# Patient Record
Sex: Male | Born: 1980 | Race: Black or African American | Hispanic: No | Smoking: Current every day smoker
Health system: Southern US, Community
[De-identification: ages and names within clinical notes are randomized; demographics above are authoritative.]

## PROBLEM LIST (undated history)

## (undated) HISTORY — PX: WISDOM TOOTH EXTRACTION: SHX21

---

## 2012-07-01 ENCOUNTER — Encounter (HOSPITAL_BASED_OUTPATIENT_CLINIC_OR_DEPARTMENT_OTHER): Payer: Self-pay | Admitting: Emergency Medicine

## 2012-07-01 ENCOUNTER — Emergency Department (HOSPITAL_BASED_OUTPATIENT_CLINIC_OR_DEPARTMENT_OTHER)
Admission: EM | Admit: 2012-07-01 | Discharge: 2012-07-01 | Disposition: A | Discharge status: Home / Self Care | Attending: Emergency Medicine | Admitting: Emergency Medicine

## 2012-07-01 ENCOUNTER — Emergency Department (HOSPITAL_BASED_OUTPATIENT_CLINIC_OR_DEPARTMENT_OTHER)

## 2012-07-01 DIAGNOSIS — S0181XA Laceration without foreign body of other part of head, initial encounter: Secondary | ICD-10-CM

## 2012-07-01 DIAGNOSIS — Y9210 Unspecified residential institution as the place of occurrence of the external cause: Secondary | ICD-10-CM | POA: Insufficient documentation

## 2012-07-01 DIAGNOSIS — S01409A Unspecified open wound of unspecified cheek and temporomandibular area, initial encounter: Secondary | ICD-10-CM | POA: Insufficient documentation

## 2012-07-01 DIAGNOSIS — S0990XA Unspecified injury of head, initial encounter: Secondary | ICD-10-CM | POA: Insufficient documentation

## 2012-07-01 DIAGNOSIS — H1130 Conjunctival hemorrhage, unspecified eye: Secondary | ICD-10-CM

## 2012-07-01 DIAGNOSIS — T148XXA Other injury of unspecified body region, initial encounter: Secondary | ICD-10-CM | POA: Insufficient documentation

## 2012-07-01 DIAGNOSIS — H538 Other visual disturbances: Secondary | ICD-10-CM | POA: Insufficient documentation

## 2012-07-01 MED ORDER — ACETAMINOPHEN 325 MG PO TABS: 650.0000 mg | ORAL_TABLET | Freq: Four times a day (QID) | ORAL | Status: DC | PRN

## 2012-07-01 MED ORDER — LIDOCAINE HCL 2 % IJ SOLN
20.0000 mL | Freq: Once | INTRAMUSCULAR | Status: AC
  Administered 2012-07-01: 20 mg via INTRADERMAL
  Filled 2012-07-01: qty 1

## 2012-07-01 MED ORDER — LIDOCAINE-EPINEPHRINE 2 %-1:100000 IJ SOLN: 20.0000 mL | Freq: Once | INTRAMUSCULAR | Status: DC
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
## 2012-07-02 NOTE — ED Provider Notes (Signed)
Medical screening examination/treatment/procedure(s) were performed by non-physician practitioner and as supervising physician I was immediately available for consultation/collaboration.   Lindzey Zent, MD 07/02/12 0016 

## 2013-03-22 ENCOUNTER — Encounter (HOSPITAL_BASED_OUTPATIENT_CLINIC_OR_DEPARTMENT_OTHER): Payer: Self-pay | Admitting: *Deleted

## 2013-03-22 ENCOUNTER — Emergency Department (HOSPITAL_BASED_OUTPATIENT_CLINIC_OR_DEPARTMENT_OTHER)
Admission: EM | Admit: 2013-03-22 | Discharge: 2013-03-22 | Disposition: A | Discharge status: Home / Self Care | Payer: No Typology Code available for payment source | Attending: Emergency Medicine | Admitting: Emergency Medicine

## 2013-03-22 ENCOUNTER — Emergency Department (HOSPITAL_BASED_OUTPATIENT_CLINIC_OR_DEPARTMENT_OTHER): Payer: No Typology Code available for payment source

## 2013-03-22 DIAGNOSIS — S161XXA Strain of muscle, fascia and tendon at neck level, initial encounter: Secondary | ICD-10-CM

## 2013-03-22 DIAGNOSIS — Y92410 Unspecified street and highway as the place of occurrence of the external cause: Secondary | ICD-10-CM | POA: Insufficient documentation

## 2013-03-22 DIAGNOSIS — Y9389 Activity, other specified: Secondary | ICD-10-CM | POA: Insufficient documentation

## 2013-03-22 DIAGNOSIS — IMO0002 Reserved for concepts with insufficient information to code with codable children: Secondary | ICD-10-CM | POA: Insufficient documentation

## 2013-03-22 DIAGNOSIS — S139XXA Sprain of joints and ligaments of unspecified parts of neck, initial encounter: Secondary | ICD-10-CM | POA: Insufficient documentation

## 2013-03-22 DIAGNOSIS — F17200 Nicotine dependence, unspecified, uncomplicated: Secondary | ICD-10-CM | POA: Insufficient documentation

## 2013-03-22 MED ORDER — OXYCODONE-ACETAMINOPHEN 5-325 MG PO TABS: 1.0000 | ORAL_TABLET | Freq: Four times a day (QID) | ORAL | Status: DC | PRN

## 2013-03-22 MED ORDER — CYCLOBENZAPRINE HCL 10 MG PO TABS: 10.0000 mg | ORAL_TABLET | Freq: Two times a day (BID) | ORAL | Status: DC | PRN
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
## 2013-03-24 ENCOUNTER — Encounter (HOSPITAL_COMMUNITY): Payer: Self-pay | Admitting: Emergency Medicine

## 2013-03-24 ENCOUNTER — Emergency Department (HOSPITAL_COMMUNITY)
Admission: EM | Admit: 2013-03-24 | Discharge: 2013-03-24 | Discharge status: Against Medical Advice | Payer: No Typology Code available for payment source | Attending: Emergency Medicine | Admitting: Emergency Medicine

## 2013-03-24 ENCOUNTER — Encounter (HOSPITAL_BASED_OUTPATIENT_CLINIC_OR_DEPARTMENT_OTHER): Payer: Self-pay | Admitting: *Deleted

## 2013-03-24 ENCOUNTER — Emergency Department (HOSPITAL_BASED_OUTPATIENT_CLINIC_OR_DEPARTMENT_OTHER)
Admission: EM | Admit: 2013-03-24 | Discharge: 2013-03-24 | Disposition: A | Discharge status: Home / Self Care | Payer: No Typology Code available for payment source | Attending: Emergency Medicine | Admitting: Emergency Medicine

## 2013-03-24 DIAGNOSIS — S4980XA Other specified injuries of shoulder and upper arm, unspecified arm, initial encounter: Secondary | ICD-10-CM | POA: Insufficient documentation

## 2013-03-24 DIAGNOSIS — S46909A Unspecified injury of unspecified muscle, fascia and tendon at shoulder and upper arm level, unspecified arm, initial encounter: Secondary | ICD-10-CM | POA: Insufficient documentation

## 2013-03-24 DIAGNOSIS — Y92410 Unspecified street and highway as the place of occurrence of the external cause: Secondary | ICD-10-CM | POA: Insufficient documentation

## 2013-03-24 DIAGNOSIS — Y9389 Activity, other specified: Secondary | ICD-10-CM | POA: Insufficient documentation

## 2013-03-24 DIAGNOSIS — S139XXA Sprain of joints and ligaments of unspecified parts of neck, initial encounter: Secondary | ICD-10-CM | POA: Insufficient documentation

## 2013-03-24 DIAGNOSIS — F17200 Nicotine dependence, unspecified, uncomplicated: Secondary | ICD-10-CM | POA: Insufficient documentation

## 2013-03-24 DIAGNOSIS — Z5321 Procedure and treatment not carried out due to patient leaving prior to being seen by health care provider: Secondary | ICD-10-CM

## 2013-03-24 DIAGNOSIS — S161XXD Strain of muscle, fascia and tendon at neck level, subsequent encounter: Secondary | ICD-10-CM

## 2013-03-24 DIAGNOSIS — S0993XA Unspecified injury of face, initial encounter: Secondary | ICD-10-CM | POA: Insufficient documentation

## 2013-03-24 MED ORDER — CYCLOBENZAPRINE HCL 5 MG PO TABS: 5.0000 mg | ORAL_TABLET | Freq: Three times a day (TID) | ORAL | Status: DC | PRN

## 2013-03-24 MED ORDER — ETODOLAC 500 MG PO TABS: 500.0000 mg | ORAL_TABLET | Freq: Two times a day (BID) | ORAL | Status: DC

## 2013-03-24 NOTE — ED Notes (Signed)
Called to treatment room. No answer in w/a.
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
## 2013-03-24 NOTE — ED Provider Notes (Signed)
Patient left without being seen post-triage.  Jimmye Norman, NP 03/24/13 [phone]

## 2013-03-24 NOTE — ED Notes (Signed)
Pt states he refused to wait for doctor any longer, attempted to reassure pt that doctor would be in soon.  Pt refused to wait any longer, walked out of ER.
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
## 2013-03-29 ENCOUNTER — Ambulatory Visit: Payer: Self-pay | Admitting: Family Medicine

## 2013-03-29 DIAGNOSIS — Z0289 Encounter for other administrative examinations: Secondary | ICD-10-CM

## 2013-07-02 ENCOUNTER — Encounter (HOSPITAL_COMMUNITY): Payer: Self-pay | Admitting: *Deleted

## 2013-07-02 DIAGNOSIS — L255 Unspecified contact dermatitis due to plants, except food: Secondary | ICD-10-CM | POA: Insufficient documentation

## 2013-07-02 DIAGNOSIS — F17200 Nicotine dependence, unspecified, uncomplicated: Secondary | ICD-10-CM | POA: Insufficient documentation

## 2013-07-02 MED ORDER — DIPHENHYDRAMINE HCL 25 MG PO CAPS
25.0000 mg | ORAL_CAPSULE | Freq: Once | ORAL | Status: AC
  Administered 2013-07-02: 25 mg via ORAL
  Filled 2013-07-02: qty 1

## 2013-07-02 NOTE — ED Notes (Signed)
The pt  Has had a generalized rash since last pm and the rash is getting worse.  No diff breathing.

## 2013-07-03 ENCOUNTER — Emergency Department (HOSPITAL_COMMUNITY)
Admission: EM | Admit: 2013-07-03 | Discharge: 2013-07-03 | Disposition: A | Discharge status: Home / Self Care | Payer: Self-pay | Attending: Emergency Medicine | Admitting: Emergency Medicine

## 2013-07-03 DIAGNOSIS — L237 Allergic contact dermatitis due to plants, except food: Secondary | ICD-10-CM

## 2013-07-03 MED ORDER — HYDROCORTISONE 1 % EX CREA: TOPICAL_CREAM | Freq: Two times a day (BID) | CUTANEOUS | Status: DC

## 2013-07-03 MED ORDER — DIPHENHYDRAMINE HCL 25 MG PO TABS: 25.0000 mg | ORAL_TABLET | Freq: Four times a day (QID) | ORAL | Status: DC | PRN

## 2013-07-03 NOTE — ED Notes (Signed)
Pt states he was working the yard with family member yesterday and the itchy, generalized rash developed after that.

## 2013-07-03 NOTE — ED Provider Notes (Addendum)
History    CSN: [id number] Arrival date & time 07/02/13  2218  First MD Initiated Contact with Patient 07/03/13 0144     Chief Complaint  Patient presents with  . Rash   (Consider location/radiation/quality/duration/timing/severity/associated sxs/prior Treatment) HPI Comments: 32 year old male with no significant past medical history who presents with a complaint of a rash. The rash started last night, has been persistent, it is itchy, it has not spread onto his trunk, chest abdomen or back. He has no rash in his genital area, no rash above the elbows, no rash above the knees. He denies any bites, denies any topical exposures until he was asked about poison ivy, he was doing yard work yesterday trimming the hedges.  Patient is a 32 y.o. male presenting with rash. The history is provided by the patient.  Rash Associated symptoms: no fever    History reviewed. No pertinent past medical history. Past Surgical History  Procedure Laterality Date  . Wisdom tooth extraction     No family history on file. History  Substance Use Topics  . Smoking status: Current Every Day Smoker -- 0.50 packs/day    Types: Cigarettes  . Smokeless tobacco: Never Used  . Alcohol Use: Yes     Comment: once a month    Review of Systems  Constitutional: Negative for fever.  Skin: Positive for rash.    Allergies  Review of patient's allergies indicates no known allergies.  Home Medications   Current Outpatient Rx  Name  Route  Sig  Dispense  Refill  . diphenhydrAMINE (BENADRYL) 25 MG tablet   Oral   Take 1 tablet (25 mg total) by mouth every 6 (six) hours as needed for itching (Rash).   30 tablet   0   . hydrocortisone cream 1 %   Topical   Apply topically 2 (two) times daily.   30 g   2    BP 129/72  Pulse 73  Temp(Src) 98.1 F (36.7 C) (Oral)  Resp 18  SpO2 98% Physical Exam  Nursing note and vitals reviewed. Constitutional: He appears well-developed and well-nourished. No  distress.  HENT:  Head: Normocephalic and atraumatic.  Mouth/Throat: Oropharynx is clear and moist. No oropharyngeal exudate.  Eyes: Conjunctivae and EOM are normal. Pupils are equal, round, and reactive to light. Right eye exhibits no discharge. Left eye exhibits no discharge. No scleral icterus.  Neck: Normal range of motion. Neck supple. No JVD present. No thyromegaly present.  Cardiovascular: Normal rate, regular rhythm, normal heart sounds and intact distal pulses.  Exam reveals no gallop and no friction rub.   No murmur heard. Pulmonary/Chest: Effort normal and breath sounds normal. No respiratory distress. He has no wheezes. He has no rales.  Abdominal: Soft. Bowel sounds are normal. He exhibits no distension and no mass. There is no tenderness.  Musculoskeletal: Normal range of motion. He exhibits no edema and no tenderness.  Lymphadenopathy:    He has no cervical adenopathy.  Neurological: He is alert. Coordination normal.  Skin: Skin is warm and dry. Rash noted. No erythema.  Erythematous papular and vesicular rash to the bilateral arms and legs below the elbows and knees, linear patterns consistent with a poison ivy pattern  Psychiatric: He has a normal mood and affect. His behavior is normal.    ED Course  Procedures (including critical care time) Labs Reviewed - No data to display No results found. 1. Contact dermatitis due to poison ivy     MDM  This  is a contact dermatitis from poison ivy exposure, he appears hemodynamically stable, he will be given topical and benadryl   Meds given in ED:  Medications  diphenhydrAMINE (BENADRYL) capsule 25 mg (25 mg Oral Given 07/02/13 2231)    New Prescriptions   DIPHENHYDRAMINE (BENADRYL) 25 MG TABLET    Take 1 tablet (25 mg total) by mouth every 6 (six) hours as needed for itching (Rash).   HYDROCORTISONE CREAM 1 %    Apply topically 2 (two) times daily.      Vida Roller, MD 07/03/13 4540  Vida Roller, MD 07/03/13  [phone]

## 2016-11-01 ENCOUNTER — Encounter (HOSPITAL_COMMUNITY): Payer: Self-pay | Admitting: *Deleted

## 2016-11-01 ENCOUNTER — Emergency Department (HOSPITAL_COMMUNITY): Payer: Self-pay

## 2016-11-01 ENCOUNTER — Emergency Department (HOSPITAL_COMMUNITY)
Admission: EM | Admit: 2016-11-01 | Discharge: 2016-11-01 | Disposition: A | Discharge status: Home / Self Care | Payer: Self-pay | Attending: Emergency Medicine | Admitting: Emergency Medicine

## 2016-11-01 DIAGNOSIS — F17210 Nicotine dependence, cigarettes, uncomplicated: Secondary | ICD-10-CM | POA: Insufficient documentation

## 2016-11-01 DIAGNOSIS — M25561 Pain in right knee: Secondary | ICD-10-CM | POA: Insufficient documentation

## 2016-11-01 DIAGNOSIS — M25511 Pain in right shoulder: Secondary | ICD-10-CM | POA: Insufficient documentation

## 2016-11-01 DIAGNOSIS — Y999 Unspecified external cause status: Secondary | ICD-10-CM | POA: Insufficient documentation

## 2016-11-01 DIAGNOSIS — Y92410 Unspecified street and highway as the place of occurrence of the external cause: Secondary | ICD-10-CM | POA: Insufficient documentation

## 2016-11-01 DIAGNOSIS — Y939 Activity, unspecified: Secondary | ICD-10-CM | POA: Insufficient documentation

## 2016-11-01 MED ORDER — ACETAMINOPHEN 325 MG PO TABS
650.0000 mg | ORAL_TABLET | Freq: Once | ORAL | Status: AC
  Administered 2016-11-01: 650 mg via ORAL
  Filled 2016-11-01: qty 2

## 2016-11-01 MED ORDER — NAPROXEN 375 MG PO TABS: 375.0000 mg | ORAL_TABLET | Freq: Two times a day (BID) | ORAL | 0 refills | Status: DC

## 2016-11-01 NOTE — ED Notes (Signed)
Bed: WTR8 Expected date:  Expected time:  Means of arrival:  Comments: 

## 2016-11-01 NOTE — Discharge Instructions (Signed)
All of your x-rays of the negative today. This likely normal musculoskeletal pain. I have given you naproxen to take twice a day. May also take Tylenol as well for the pain. Standing under a hot shower will help with the muscle pain. If her symptoms not resolve in the next 5-7 days follow-up with a primary care doctor. Return to the ED if her symptoms worsen.
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
## 2021-08-16 ENCOUNTER — Emergency Department (HOSPITAL_COMMUNITY)
Admission: EM | Admit: 2021-08-16 | Discharge: 2021-08-16 | Disposition: A | Discharge status: Home / Self Care | Payer: Self-pay | Attending: Emergency Medicine | Admitting: Emergency Medicine

## 2021-08-16 ENCOUNTER — Other Ambulatory Visit: Payer: Self-pay

## 2021-08-16 ENCOUNTER — Encounter (HOSPITAL_COMMUNITY): Payer: Self-pay | Admitting: Emergency Medicine

## 2021-08-16 DIAGNOSIS — K122 Cellulitis and abscess of mouth: Secondary | ICD-10-CM | POA: Insufficient documentation

## 2021-08-16 DIAGNOSIS — F17210 Nicotine dependence, cigarettes, uncomplicated: Secondary | ICD-10-CM | POA: Insufficient documentation

## 2021-08-16 DIAGNOSIS — L0291 Cutaneous abscess, unspecified: Secondary | ICD-10-CM

## 2021-08-16 MED ORDER — LIDOCAINE-EPINEPHRINE (PF) 2 %-1:200000 IJ SOLN
10.0000 mL | Freq: Once | INTRAMUSCULAR | Status: AC
  Administered 2021-08-16: 10 mL
  Filled 2021-08-16: qty 20

## 2021-08-16 MED ORDER — CEPHALEXIN 250 MG PO CAPS
500.0000 mg | ORAL_CAPSULE | Freq: Once | ORAL | Status: AC
  Administered 2021-08-16: 500 mg via ORAL
  Filled 2021-08-16: qty 2

## 2021-08-16 MED ORDER — CEPHALEXIN 500 MG PO CAPS: 500.0000 mg | ORAL_CAPSULE | Freq: Four times a day (QID) | ORAL | 0 refills | Status: DC

## 2021-08-16 NOTE — ED Provider Notes (Signed)
[facility] EMERGENCY DEPARTMENT Provider Note   CSN: [id number] Arrival date & time: 08/16/21  2028     History Chief Complaint  Patient presents with   Abscess    Jeremy Chambers is a 40 y.o. male.  HPI  Patient presenting with abscess to right jaw x1 week.  Started as a small bump, has been significantly increasing in size.  Patient has tried to pop it himself and is also tried applying mupirocin cream and using peroxide.  States that the wound has opened up and has been draining, he denies any fevers or chills at the home.  Denies any aggravating factors.  Patient is not diabetic.   History reviewed. No pertinent past medical history.  There are no problems to display for this patient.   Past Surgical History:  Procedure Laterality Date   WISDOM TOOTH EXTRACTION         No family history on file.  Social History   Tobacco Use   Smoking status: Every Day    Packs/day: 0.50    Types: Cigarettes   Smokeless tobacco: Never  Substance Use Topics   Alcohol use: Yes    Comment: once a month   Drug use: No    Home Medications Prior to Admission medications   Medication Sig Start Date End Date Taking? Authorizing Provider  diphenhydrAMINE (BENADRYL) 25 MG tablet Take 1 tablet (25 mg total) by mouth every 6 (six) hours as needed for itching (Rash). 07/03/13   Eber Hong, MD  hydrocortisone cream 1 % Apply topically 2 (two) times daily. 07/03/13   Eber Hong, MD  naproxen (NAPROSYN) 375 MG tablet Take 1 tablet (375 mg total) by mouth 2 (two) times daily. 11/01/16   Rise Mu, PA-C    Allergies    Patient has no known allergies.  Review of Systems   Review of Systems  Constitutional:  Negative for fever.  HENT:  Negative for drooling, facial swelling and trouble swallowing.   Gastrointestinal:  Negative for nausea.  Skin:  Positive for color change and wound.   Physical Exam Updated Vital Signs BP 127/74 (BP Location: Right Arm)    Pulse 89   Temp 98.8 F (37.1 C) (Oral)   Resp 16   Ht 6\' 3"  (1.905 m)   Wt 132 kg   SpO2 98%   BMI 36.37 kg/m   Physical Exam Vitals and nursing note reviewed. Exam conducted with a chaperone present.  Constitutional:      General: He is not in acute distress.    Appearance: Normal appearance.  HENT:     Head: Normocephalic and atraumatic.  Eyes:     General: No scleral icterus.    Extraocular Movements: Extraocular movements intact.     Pupils: Pupils are equal, round, and reactive to light.  Skin:    Coloration: Skin is not jaundiced.     Comments: See photos. No surrounding erythema consistent with cellulitis.  There is an open abscess with purulent material. Not actively draining.  Neurological:     Mental Status: He is alert. Mental status is at baseline.     Coordination: Coordination normal.     ED Results / Procedures / Treatments   Labs (all labs ordered are listed, but only abnormal results are displayed) Labs Reviewed - No data to display  EKG None  Radiology No results found.  Procedures . Incision and Drainage  Date/Time: 08/16/2021 9:57 PM Performed by: 08/18/2021, PA-C Authorized by: Theron Arista, PA-C  Consent:    Consent obtained:  Verbal   Consent given by:  Patient   Risks, benefits, and alternatives were discussed: yes     Risks discussed:  Incomplete drainage, bleeding, pain and infection   Alternatives discussed:  No treatment, delayed treatment and alternative treatment Location:    Type:  Abscess   Location:  Head   Head location:  Face Pre-procedure details:    Skin preparation:  Chlorhexidine with alcohol Sedation:    Sedation type:  None Anesthesia:    Anesthesia method:  Local infiltration   Local anesthetic:  Lidocaine 1% WITH epi Procedure type:    Complexity:  Simple Procedure details:    Incision types:  Stab incision   Incision depth:  Dermal   Wound management:  Probed and deloculated   Drainage:  Purulent    Drainage amount:  Scant   Wound treatment:  Wound left open   Packing materials:  None Post-procedure details:    Procedure completion:  Tolerated well, no immediate complications   Medications Ordered in ED Medications - No data to display  ED Course  I have reviewed the triage vital signs and the nursing notes.  Pertinent labs & imaging results that were available during my care of the patient were reviewed by me and considered in my medical decision making (see chart for details).    MDM Rules/Calculators/A&P                           Patient with abscess to right jaw.  No underlying cellulitis, location is not consistent with a suppurative parotitis.  He is afebrile with stable vital signs, will I&D.  The wound is already open, will  deloculated.  Patient handled the procedure well, no immediate complications.  There was scant purulent drainage expressed, wound left open.  We will put him on antibiotic and have him follow-up with the wound check in 4 days if there is no improvement.  Return precautions were discussed.  Patient is appropriate for discharge at this time.  Final Clinical Impression(s) / ED Diagnoses Final diagnoses:  None    Rx / DC Orders ED Discharge Orders     None        Theron Arista, Cordelia Poche 08/16/21 2159    Cathren Laine, MD 08/20/21 [phone]

## 2021-08-16 NOTE — ED Triage Notes (Signed)
Patient reports worsening skin abscess at right jaw with swelling and purulent drainage onset this week , no fever or chills .

## 2021-08-16 NOTE — Discharge Instructions (Addendum)
Take keflex 4x daily for 5 days.  We are giving you the first dose here in the ED, we can start once you pick it up in the morning tomorrow. Wash with soap and water, do not use any peroxide or alcohol. I recommend using heat compresses and hot water when possible as this can help express some of the pus. In 4 days if you do not notice significant improvement please go to an urgent care or ER for wound recheck. If you develop fevers, chills, spreading redness please return back to the ED for further evaluation.

## 2021-08-16 NOTE — ED Notes (Signed)
abscess to rt side of cheek. Per pt there for a week. Has became larger and worse in pain

## 2022-07-25 ENCOUNTER — Emergency Department (HOSPITAL_COMMUNITY)
Admission: EM | Admit: 2022-07-25 | Discharge: 2022-07-25 | Disposition: A | Discharge status: Home / Self Care | Payer: Self-pay | Attending: Emergency Medicine | Admitting: Emergency Medicine

## 2022-07-25 ENCOUNTER — Other Ambulatory Visit: Payer: Self-pay

## 2022-07-25 DIAGNOSIS — L0291 Cutaneous abscess, unspecified: Secondary | ICD-10-CM

## 2022-07-25 DIAGNOSIS — L02413 Cutaneous abscess of right upper limb: Secondary | ICD-10-CM | POA: Insufficient documentation

## 2022-07-25 DIAGNOSIS — Z23 Encounter for immunization: Secondary | ICD-10-CM | POA: Insufficient documentation

## 2022-07-25 MED ORDER — LIDOCAINE-EPINEPHRINE (PF) 2 %-1:200000 IJ SOLN
10.0000 mL | Freq: Once | INTRAMUSCULAR | Status: AC
  Administered 2022-07-25: 10 mL
  Filled 2022-07-25: qty 20

## 2022-07-25 MED ORDER — TETANUS-DIPHTH-ACELL PERTUSSIS 5-2.5-18.5 LF-MCG/0.5 IM SUSY
0.5000 mL | PREFILLED_SYRINGE | Freq: Once | INTRAMUSCULAR | Status: AC
  Administered 2022-07-25: 0.5 mL via INTRAMUSCULAR
  Filled 2022-07-25: qty 0.5

## 2022-07-25 MED ORDER — DOXYCYCLINE HYCLATE 100 MG PO CAPS: 100.0000 mg | ORAL_CAPSULE | Freq: Two times a day (BID) | ORAL | 0 refills | Status: DC

## 2022-07-25 NOTE — ED Triage Notes (Signed)
Pt with raised, swollen area to R forearm x 2 days. Denies injury or known inset bite. Reports puss and blood drained from it yesterday.

## 2022-07-25 NOTE — ED Notes (Signed)
Provider at bedside to I&D area to RFA

## 2022-07-25 NOTE — Discharge Instructions (Addendum)
You are seen today with abscess in the skin.  This was drained, wash with soap and water.  Can apply topical antibiotic as indicated.  Take the doxycycline twice daily for 10 days, this will treat the superficial infection.  Follow-up with your primary care doctor, information above.  Return to the ED if you have fever, redness spreading up your arm, new or concerning symptoms.  Your tetanus was updated today, good for 10 years.

## 2022-07-25 NOTE — ED Provider Notes (Signed)
[facility] EMERGENCY DEPARTMENT Provider Note   CSN: [id number] Arrival date & time: 07/25/22  0815     History  No chief complaint on file.   Jeremy Chambers is a 41 y.o. male.  HPI  Patient presents with abscess to extensor right forearm.  Started 2 days ago as a small pimple, he noticed it while at work.  He works at [employer], he squeezed it and pus and blood came out yesterday.  It is painful, there is some surrounding redness.  Denies any known bug bites, he has not been outside recently.  Unsure about his last tetanus, denies any history of diabetes and MRSA colonization.  Home Medications Prior to Admission medications   Medication Sig Start Date End Date Taking? Authorizing Provider  cephALEXin (KEFLEX) 500 MG capsule Take 1 capsule (500 mg total) by mouth 4 (four) times daily. 08/16/21   Theron Arista, PA-C  diphenhydrAMINE (BENADRYL) 25 MG tablet Take 1 tablet (25 mg total) by mouth every 6 (six) hours as needed for itching (Rash). 07/03/13   Eber Hong, MD  hydrocortisone cream 1 % Apply topically 2 (two) times daily. 07/03/13   Eber Hong, MD  naproxen (NAPROSYN) 375 MG tablet Take 1 tablet (375 mg total) by mouth 2 (two) times daily. 11/01/16   Rise Mu, PA-C      Allergies    Patient has no known allergies.    Review of Systems   Review of Systems  Physical Exam Updated Vital Signs BP 135/87 (BP Location: Left Arm)   Pulse 68   Temp 98.6 F (37 C) (Oral)   Resp 18   SpO2 99%  Physical Exam Vitals and nursing note reviewed. Exam conducted with a chaperone present.  Constitutional:      General: He is not in acute distress.    Appearance: Normal appearance.  HENT:     Head: Normocephalic and atraumatic.  Eyes:     General: No scleral icterus.    Extraocular Movements: Extraocular movements intact.     Pupils: Pupils are equal, round, and reactive to light.  Cardiovascular:     Pulses: Normal pulses.  Skin:    Capillary  Refill: Capillary refill takes less than 2 seconds.     Coloration: Skin is not jaundiced.     Findings: Erythema present.     Comments: 1.5 cm raised pustule with surrounding induration.  There is overlying erythema roughly 3 cm to the extensor forearm.  Neurological:     Mental Status: He is alert. Mental status is at baseline.     Coordination: Coordination normal.     ED Results / Procedures / Treatments   Labs (all labs ordered are listed, but only abnormal results are displayed) Labs Reviewed - No data to display  EKG None  Radiology No results found.  Procedures .Marland KitchenIncision and Drainage  Date/Time: 07/25/2022 9:24 AM  Performed by: Theron Arista, PA-C Authorized by: Theron Arista, PA-C   Consent:    Consent obtained:  Verbal   Consent given by:  Patient   Risks discussed:  Bleeding, incomplete drainage, pain and damage to other organs   Alternatives discussed:  No treatment Universal protocol:    Procedure explained and questions answered to patient or proxy's satisfaction: yes     Relevant documents present and verified: yes     Test results available : yes     Imaging studies available: yes     Required blood products, implants, devices, and special  equipment available: yes     Site/side marked: yes     Immediately prior to procedure, a time out was called: yes     Patient identity confirmed:  Verbally with patient Location:    Type:  Abscess   Size:  1.5 cm   Location:  Upper extremity   Upper extremity location:  Arm   Arm location:  R lower arm Pre-procedure details:    Skin preparation:  Betadine Anesthesia:    Anesthesia method:  Local infiltration   Local anesthetic:  Lidocaine 1% WITH epi Procedure type:    Complexity:  Simple Procedure details:    Ultrasound guidance: no     Needle aspiration: no     Incision types:  Single straight   Incision depth:  Subcutaneous   Wound management:  Probed and deloculated, irrigated with saline and extensive  cleaning   Drainage:  Purulent   Drainage amount:  Moderate   Wound treatment:  Wound left open   Packing materials:  None Post-procedure details:    Procedure completion:  Tolerated well, no immediate complications     Medications Ordered in ED Medications  lidocaine-EPINEPHrine (XYLOCAINE W/EPI) 2 %-1:200000 (PF) injection 10 mL (has no administration in time range)  Tdap (BOOSTRIX) injection 0.5 mL (has no administration in time range)    ED Course/ Medical Decision Making/ A&P                           Medical Decision Making Risk Prescription drug management.   Patient presents due to forearm abscess.    Patient is neurovascularly intact with brisk cap refill, full ROM and radial pulse 2+.  Vitals are stable, does not appear septic.  I&D performed, purulent drainage excised.  There are some surrounding erythema, will cover with doxycycline.  Patient does not have a PCP, referral made.        Final Clinical Impression(s) / ED Diagnoses Final diagnoses:  None    Rx / DC Orders ED Discharge Orders     None         Theron Arista, [place] 07/25/22 1531    Tegeler, Canary Brim, MD 07/25/22 [phone]

## 2022-09-24 ENCOUNTER — Telehealth: Payer: Self-pay

## 2022-09-24 ENCOUNTER — Ambulatory Visit: Payer: Self-pay | Admitting: Nurse Practitioner

## 2022-09-24 NOTE — Telephone Encounter (Signed)
Pt was a no show for a NP with [facility] on 09/24/22, I sent a letter.

## 2022-09-24 NOTE — Telephone Encounter (Signed)
1st no show, fee waived, letter sent 

## 2022-09-24 NOTE — Telephone Encounter (Signed)
Noted  

## 2022-11-20 ENCOUNTER — Encounter (HOSPITAL_BASED_OUTPATIENT_CLINIC_OR_DEPARTMENT_OTHER): Payer: Self-pay

## 2022-11-20 ENCOUNTER — Encounter (HOSPITAL_COMMUNITY): Payer: Self-pay

## 2022-11-20 ENCOUNTER — Emergency Department (HOSPITAL_BASED_OUTPATIENT_CLINIC_OR_DEPARTMENT_OTHER): Payer: Commercial Managed Care - HMO | Admitting: Radiology

## 2022-11-20 ENCOUNTER — Observation Stay (HOSPITAL_BASED_OUTPATIENT_CLINIC_OR_DEPARTMENT_OTHER)
Admission: EM | Admit: 2022-11-20 | Discharge: 2022-11-21 | Disposition: A | Discharge status: Home / Self Care | Payer: Commercial Managed Care - HMO | Attending: Student | Admitting: Student

## 2022-11-20 DIAGNOSIS — A403 Sepsis due to Streptococcus pneumoniae: Secondary | ICD-10-CM

## 2022-11-20 DIAGNOSIS — J9601 Acute respiratory failure with hypoxia: Secondary | ICD-10-CM | POA: Diagnosis not present

## 2022-11-20 DIAGNOSIS — Z1152 Encounter for screening for COVID-19: Secondary | ICD-10-CM | POA: Diagnosis not present

## 2022-11-20 DIAGNOSIS — A419 Sepsis, unspecified organism: Secondary | ICD-10-CM | POA: Diagnosis not present

## 2022-11-20 DIAGNOSIS — E66811 Obesity, class 1: Secondary | ICD-10-CM | POA: Diagnosis present

## 2022-11-20 DIAGNOSIS — R030 Elevated blood-pressure reading, without diagnosis of hypertension: Secondary | ICD-10-CM | POA: Diagnosis not present

## 2022-11-20 DIAGNOSIS — R6520 Severe sepsis without septic shock: Secondary | ICD-10-CM | POA: Insufficient documentation

## 2022-11-20 DIAGNOSIS — E669 Obesity, unspecified: Secondary | ICD-10-CM

## 2022-11-20 DIAGNOSIS — J111 Influenza due to unidentified influenza virus with other respiratory manifestations: Secondary | ICD-10-CM

## 2022-11-20 DIAGNOSIS — Z720 Tobacco use: Secondary | ICD-10-CM | POA: Diagnosis not present

## 2022-11-20 DIAGNOSIS — Z79899 Other long term (current) drug therapy: Secondary | ICD-10-CM | POA: Insufficient documentation

## 2022-11-20 DIAGNOSIS — F17210 Nicotine dependence, cigarettes, uncomplicated: Secondary | ICD-10-CM | POA: Insufficient documentation

## 2022-11-20 DIAGNOSIS — F17200 Nicotine dependence, unspecified, uncomplicated: Secondary | ICD-10-CM | POA: Diagnosis present

## 2022-11-20 DIAGNOSIS — J1100 Influenza due to unidentified influenza virus with unspecified type of pneumonia: Secondary | ICD-10-CM | POA: Insufficient documentation

## 2022-11-20 DIAGNOSIS — J189 Pneumonia, unspecified organism: Secondary | ICD-10-CM | POA: Diagnosis not present

## 2022-11-20 DIAGNOSIS — R059 Cough, unspecified: Secondary | ICD-10-CM | POA: Diagnosis present

## 2022-11-20 DIAGNOSIS — R0902 Hypoxemia: Secondary | ICD-10-CM

## 2022-11-20 LAB — CREATININE, SERUM
Creatinine, Ser: 0.91 mg/dL (ref 0.61–1.24)
GFR, Estimated: >60 mL/min (ref >60)

## 2022-11-20 LAB — RESP PANEL BY RT-PCR (FLU A&B, COVID) ARPGX2
Influenza A by PCR: POSITIVE — AB
Influenza B by PCR: NEGATIVE
SARS Coronavirus 2 by RT PCR: NEGATIVE

## 2022-11-20 LAB — COMPREHENSIVE METABOLIC PANEL
ALT: 15 U/L (ref 0–44)
AST: 23 U/L (ref 15–41)
Albumin: 3.5 g/dL (ref 3.5–5.0)
Alkaline Phosphatase: 61 U/L (ref 38–126)
Anion gap: 12 (ref 5–15)
BUN: 14 mg/dL (ref 6–20)
CO2: 23 mmol/L (ref 22–32)
Calcium: 8.3 mg/dL — ABNORMAL LOW (ref 8.9–10.3)
Chloride: 101 mmol/L (ref 98–111)
Creatinine, Ser: 0.89 mg/dL (ref 0.61–1.24)
GFR, Estimated: >60 mL/min (ref >60)
Glucose, Bld: 124 mg/dL — ABNORMAL HIGH (ref 70–99)
Potassium: 3.7 mmol/L (ref 3.5–5.1)
Sodium: 136 mmol/L (ref 135–145)
Total Bilirubin: 0.5 mg/dL (ref 0.3–1.2)
Total Protein: 6.2 g/dL — ABNORMAL LOW (ref 6.5–8.1)

## 2022-11-20 LAB — RESPIRATORY PANEL BY PCR

## 2022-11-20 LAB — CBC
HCT: 42.6 % (ref 39.0–52.0)
Hemoglobin: 14.5 g/dL (ref 13.0–17.0)
MCH: 31.3 pg (ref 26.0–34.0)
MCHC: 34 g/dL (ref 30.0–36.0)
MCV: 92 fL (ref 80.0–100.0)
Platelets: 305 10*3/uL (ref 150–400)
RBC: 4.63 MIL/uL (ref 4.22–5.81)
RDW: 13.3 % (ref 11.5–15.5)
WBC: 15.3 10*3/uL — ABNORMAL HIGH (ref 4.0–10.5)
nRBC: 0 % (ref 0.0–0.2)

## 2022-11-20 LAB — CBC WITH DIFFERENTIAL/PLATELET
Abs Immature Granulocytes: 0.1 10*3/uL — ABNORMAL HIGH (ref 0.00–0.07)
Basophils Absolute: 0 10*3/uL (ref 0.0–0.1)
Basophils Relative: 0 %
Eosinophils Absolute: 0 10*3/uL (ref 0.0–0.5)
Eosinophils Relative: 0 %
HCT: 40.6 % (ref 39.0–52.0)
Hemoglobin: 14 g/dL (ref 13.0–17.0)
Immature Granulocytes: 1 %
Lymphocytes Relative: 7 %
Lymphs Abs: 1.1 10*3/uL (ref 0.7–4.0)
MCH: 31.2 pg (ref 26.0–34.0)
MCHC: 34.5 g/dL (ref 30.0–36.0)
MCV: 90.4 fL (ref 80.0–100.0)
Monocytes Absolute: 1.5 10*3/uL — ABNORMAL HIGH (ref 0.1–1.0)
Monocytes Relative: 9 %
Neutro Abs: 13.7 10*3/uL — ABNORMAL HIGH (ref 1.7–7.7)
Neutrophils Relative %: 83 %
Platelets: 309 10*3/uL (ref 150–400)
RBC: 4.49 MIL/uL (ref 4.22–5.81)
RDW: 13.2 % (ref 11.5–15.5)
WBC: 16.5 10*3/uL — ABNORMAL HIGH (ref 4.0–10.5)
nRBC: 0 % (ref 0.0–0.2)

## 2022-11-20 LAB — LACTIC ACID, PLASMA
Lactic Acid, Venous: 0.6 mmol/L (ref 0.5–1.9)
Lactic Acid, Venous: 0.8 mmol/L (ref 0.5–1.9)

## 2022-11-20 MED ORDER — MELATONIN 5 MG PO TABS
5.0000 mg | ORAL_TABLET | Freq: Once | ORAL | Status: AC
  Administered 2022-11-20: 5 mg via ORAL
  Filled 2022-11-20: qty 1

## 2022-11-20 MED ORDER — DEXTROSE-NACL 5-0.9 % IV SOLN: INTRAVENOUS | Status: DC

## 2022-11-20 MED ORDER — SODIUM CHLORIDE 0.9 % IV SOLN: INTRAVENOUS | Status: DC | PRN

## 2022-11-20 MED ORDER — DIPHENHYDRAMINE HCL 25 MG PO CAPS: 25.0000 mg | ORAL_CAPSULE | Freq: Four times a day (QID) | ORAL | Status: DC | PRN

## 2022-11-20 MED ORDER — SODIUM CHLORIDE 0.9 % IV SOLN
2.0000 g | INTRAVENOUS | Status: DC
  Administered 2022-11-21: 2 g via INTRAVENOUS
  Filled 2022-11-20: qty 20

## 2022-11-20 MED ORDER — GUAIFENESIN-DM 100-10 MG/5ML PO SYRP: 5.0000 mL | ORAL_SOLUTION | ORAL | Status: DC | PRN

## 2022-11-20 MED ORDER — ENOXAPARIN SODIUM 60 MG/0.6ML IJ SOSY
60.0000 mg | PREFILLED_SYRINGE | INTRAMUSCULAR | Status: DC
  Administered 2022-11-20: 60 mg via SUBCUTANEOUS
  Filled 2022-11-20: qty 0.6

## 2022-11-20 MED ORDER — IPRATROPIUM-ALBUTEROL 0.5-2.5 (3) MG/3ML IN SOLN
3.0000 mL | Freq: Four times a day (QID) | RESPIRATORY_TRACT | Status: DC
  Administered 2022-11-20: 3 mL via RESPIRATORY_TRACT
  Filled 2022-11-20: qty 3

## 2022-11-20 MED ORDER — SODIUM CHLORIDE 0.9 % IV SOLN
500.0000 mg | Freq: Once | INTRAVENOUS | Status: AC
  Administered 2022-11-20: 500 mg via INTRAVENOUS
  Filled 2022-11-20: qty 5

## 2022-11-20 MED ORDER — ACETAMINOPHEN 650 MG RE SUPP: 650.0000 mg | Freq: Four times a day (QID) | RECTAL | Status: DC | PRN

## 2022-11-20 MED ORDER — IBUPROFEN 200 MG PO TABS: 400.0000 mg | ORAL_TABLET | Freq: Four times a day (QID) | ORAL | Status: DC | PRN

## 2022-11-20 MED ORDER — ONDANSETRON HCL 4 MG PO TABS: 4.0000 mg | ORAL_TABLET | Freq: Four times a day (QID) | ORAL | Status: DC | PRN

## 2022-11-20 MED ORDER — ACETAMINOPHEN 325 MG PO TABS
650.0000 mg | ORAL_TABLET | Freq: Once | ORAL | Status: AC
  Administered 2022-11-20: 650 mg via ORAL
  Filled 2022-11-20: qty 2

## 2022-11-20 MED ORDER — AZITHROMYCIN 250 MG PO TABS: 500.0000 mg | ORAL_TABLET | Freq: Every day | ORAL | Status: DC

## 2022-11-20 MED ORDER — SODIUM CHLORIDE 0.9 % IV SOLN
1.0000 g | Freq: Once | INTRAVENOUS | Status: AC
  Administered 2022-11-20: 1 g via INTRAVENOUS
  Filled 2022-11-20: qty 10

## 2022-11-20 MED ORDER — IPRATROPIUM-ALBUTEROL 0.5-2.5 (3) MG/3ML IN SOLN: 3.0000 mL | RESPIRATORY_TRACT | Status: DC | PRN

## 2022-11-20 MED ORDER — ONDANSETRON HCL 4 MG/2ML IJ SOLN: 4.0000 mg | Freq: Four times a day (QID) | INTRAMUSCULAR | Status: DC | PRN

## 2022-11-20 MED ORDER — ACETAMINOPHEN 325 MG PO TABS: 650.0000 mg | ORAL_TABLET | Freq: Four times a day (QID) | ORAL | Status: DC | PRN

## 2022-11-20 MED ORDER — OSELTAMIVIR PHOSPHATE 75 MG PO CAPS
75.0000 mg | ORAL_CAPSULE | Freq: Two times a day (BID) | ORAL | Status: DC
  Administered 2022-11-20 – 2022-11-21 (×2): 75 mg via ORAL
  Filled 2022-11-20 (×2): qty 1

## 2022-11-20 MED ORDER — IPRATROPIUM-ALBUTEROL 0.5-2.5 (3) MG/3ML IN SOLN
3.0000 mL | Freq: Once | RESPIRATORY_TRACT | Status: AC
  Administered 2022-11-20: 3 mL via RESPIRATORY_TRACT
  Filled 2022-11-20: qty 3

## 2022-11-20 NOTE — ED Provider Notes (Signed)
[facility] EMERGENCY DEPT Provider Note   CSN: [id number] Arrival date & time: 11/20/22  0737     History  Chief Complaint  Patient presents with   Cough    Jeremy Chambers is a 41 y.o. male.  HPI Patient  has been sick for about 2 weeks.  About every other day he seemed to be getting better but then symptoms rebound again.  He has had sinus congestion and pressure with drainage of some blood-tinged material.  Facial pressure and some headache.  He reports he has had extreme amounts of coughing and more recently that is gotten worse.  He reports upper chest pain with cough.  Last night he became short of breath and he could tell he had a fever although he did not measure it at home.  He felt hot and had chills.  Patient has not been tested for COVID or had any treatment to date.  He reports at baseline he is healthy and has no known medical problems.  Smokes approximately 1 pack/day of cigarettes.  Occasional social alcohol use.  He reports he has a cousin who has been sick as well but they have not had any specific testing or diagnosis.    Home Medications Prior to Admission medications   Medication Sig Start Date End Date Taking? Authorizing Provider  amoxicillin-clavulanate (AUGMENTIN) 875-125 MG tablet Take 1 tablet by mouth 2 (two) times daily for 4 days. 11/21/22 11/25/22 Yes Almon Hercules, MD  dextromethorphan-guaiFENesin (MUCINEX DM) 30-600 MG 12hr tablet Take 1 tablet by mouth 2 (two) times daily for 5 days. 11/21/22 11/26/22 Yes Almon Hercules, MD  guaiFENesin (MUCINEX) 600 MG 12 hr tablet Take 600 mg by mouth 2 (two) times daily as needed for cough (congestion).   Yes [provider]  doxycycline (VIBRA-TABS) 100 MG tablet Take 1 tablet (100 mg total) by mouth every 12 (twelve) hours for 4 days. 11/21/22 11/25/22  Almon Hercules, MD  oseltamivir (TAMIFLU) 75 MG capsule Take 1 capsule (75 mg total) by mouth 2 (two) times daily for 4 days. 11/21/22 11/25/22   Almon Hercules, MD      Allergies    Patient has no known allergies.    Review of Systems   Review of Systems  Physical Exam Updated Vital Signs BP (!) 166/90 (BP Location: Left Arm)   Pulse 68   Temp 99.9 F (37.7 C) (Oral)   Resp 20   Ht 6\' 3"  (1.905 m)   Wt 122.5 kg   SpO2 98%   BMI 33.75 kg/m  Physical Exam Constitutional:      Appearance: Normal appearance.     Comments: Alert nontoxic well-nourished well-developed.  Mild tachypnea.  Observing monitor, on room air patient ranges from about 88 to 92% monitor shows narrow complex rhythm in the 90s.  HENT:     Nose:     Comments: Nares are patent.  Mild erythema of the turbinates but no significant drainage or boggy appearance.    Mouth/Throat:     Mouth: Mucous membranes are moist.     Pharynx: Oropharynx is clear.  Eyes:     Extraocular Movements: Extraocular movements intact.     Conjunctiva/sclera: Conjunctivae normal.  Cardiovascular:     Rate and Rhythm: Normal rate and regular rhythm.  Pulmonary:     Comments: Diffuse wheezing throughout lung fields.  Decreased airflow to the bases. Abdominal:     General: There is no distension.     Palpations: Abdomen  is soft.     Tenderness: There is no abdominal tenderness. There is no guarding.  Musculoskeletal:        General: No swelling or tenderness.     Cervical back: Neck supple.     Right lower leg: No edema.     Left lower leg: No edema.  Skin:    General: Skin is warm and dry.  Neurological:     General: No focal deficit present.     Mental Status: He is alert and oriented to person, place, and time.     Motor: No weakness.     Coordination: Coordination normal.  Psychiatric:        Mood and Affect: Mood normal.     ED Results / Procedures / Treatments   Labs (all labs ordered are listed, but only abnormal results are displayed) Labs Reviewed  RESP PANEL BY RT-PCR (FLU A&B, COVID) ARPGX2 - Abnormal; Notable for the following components:       Result Value   Influenza A by PCR POSITIVE (*)    All other components within normal limits  RESPIRATORY PANEL BY PCR - Abnormal; Notable for the following components:   Influenza A H1 2009 DETECTED (*)    All other components within normal limits  COMPREHENSIVE METABOLIC PANEL - Abnormal; Notable for the following components:   Glucose, Bld 124 (*)    Calcium 8.3 (*)    Total Protein 6.2 (*)    All other components within normal limits  CBC WITH DIFFERENTIAL/PLATELET - Abnormal; Notable for the following components:   WBC 16.5 (*)    Neutro Abs 13.7 (*)    Monocytes Absolute 1.5 (*)    Abs Immature Granulocytes 0.10 (*)    All other components within normal limits  CBC - Abnormal; Notable for the following components:   WBC 15.3 (*)    All other components within normal limits  BASIC METABOLIC PANEL - Abnormal; Notable for the following components:   Glucose, Bld 133 (*)    Calcium 8.0 (*)    All other components within normal limits  CBC - Abnormal; Notable for the following components:   RBC 4.19 (*)    Hemoglobin 12.9 (*)    All other components within normal limits  CULTURE, BLOOD (ROUTINE X 2)  CULTURE, BLOOD (ROUTINE X 2)  LACTIC ACID, PLASMA  LACTIC ACID, PLASMA  HIV ANTIBODY (ROUTINE TESTING W REFLEX)  CREATININE, SERUM    EKG None  Radiology No results found.  Procedures Procedures    Medications Ordered in ED Medications  acetaminophen (TYLENOL) tablet 650 mg (650 mg Oral Given 11/20/22 0841)  ipratropium-albuterol (DUONEB) 0.5-2.5 (3) MG/3ML nebulizer solution 3 mL (3 mLs Nebulization Given 11/20/22 0906)  cefTRIAXone (ROCEPHIN) 1 g in sodium chloride 0.9 % 100 mL IVPB (0 g Intravenous Stopped 11/20/22 1144)  azithromycin (ZITHROMAX) 500 mg in sodium chloride 0.9 % 250 mL IVPB (0 mg Intravenous Stopped 11/20/22 1938)  melatonin tablet 5 mg (5 mg Oral Given 11/20/22 2152)    ED Course/ Medical Decision Making/ A&P                           Medical  Decision Making Amount and/or Complexity of Data Reviewed Labs: ordered. Radiology: ordered.  Risk OTC drugs. Prescription drug management. Decision regarding hospitalization.   Patient denies past medical history.  He reports 2 weeks of respiratory symptoms.  He is febrile in the emergency department at 103.2.  On exam patient has extensive wheezing and decreased airflow.  We will proceed with diagnostic evaluation for pneumonia, viral versus bacterial, congestive heart failure, ACS.  At this time lower suspicion for ACS or CHF.  Patient does not have prior history and clinically does not have peripheral edema or crackles.  Main risk factors are smoking and male.  With extensive wheezing will initiate empiric DuoNeb treatment while awaiting additional diagnostic information.  Chest x-ray visually examined and reviewed by myself as well as radiology interpretation positive for patchy consolidation of pneumonia in the right upper lung.  Patient does test influenza positive.  At this time with hypoxia and pneumonia on chest x-ray, I suspect secondary bacterial pneumonia after having an influenza.  Patient is alert but hypoxic and has had fever up to 103.2 he does meet SIRS criteria.  Blood pressures however are stable and heart rate is less than 90.  We will proceed with antibiotics, nebulizer therapies and supplemental oxygen.  With oxygen requirement and active pneumonia will plan for admission for ongoing antibiotics and respiratory support.       Final Clinical Impression(s) / ED Diagnoses Final diagnoses:  Pneumonia of right upper lobe due to infectious organism  Influenza  Hypoxia    Rx / DC Orders ED Discharge Orders          Ordered    doxycycline (VIBRA-TABS) 100 MG tablet  Every 12 hours        11/21/22 0954    amoxicillin-clavulanate (AUGMENTIN) 875-125 MG tablet  2 times daily        11/21/22 0954    oseltamivir (TAMIFLU) 75 MG capsule  2 times daily         11/21/22 0954    dextromethorphan-guaiFENesin (MUCINEX DM) 30-600 MG 12hr tablet  2 times daily        11/21/22 0954    Increase activity slowly        11/21/22 0954    Discharge instructions       Comments: It has been a pleasure taking care of you!  You were hospitalized due to pneumonia and influenza infection for which you have been started on antibiotics.  Your symptoms improved to the point we think it is safe to let you go home and continue antibiotics orally.  Please review your new medication list and the directions on your medications before you take them.  It is very important that you establish care with primary care doctor as soon as possible.  It is important that you quit smoking cigarettes.  You may use nicotine patch to help you quit smoking.  Nicotine patch is available over-the-counter.  You may also discuss other options to help you quit smoking with your primary care doctor. You can also talk to professional counselors at 1-800-QUIT-NOW 6783765521) for free smoking cessation counseling.     Take care,   11/21/22 0954    Diet general        11/21/22 0954    Call MD for:  temperature >100.4        11/21/22 0954    Call MD for:  difficulty breathing, headache or visual disturbances        11/21/22 0954    Call MD for:  persistant dizziness or light-headedness        11/21/22 0954    Call MD for:  extreme fatigue        11/21/22 0954  Arby BarrettePfeiffer, Lynell Greenhouse, MD 11/23/22 [phone]

## 2022-11-20 NOTE — ED Notes (Signed)
O2 sats drop into 88%  Placed on 2 L via [place]

## 2022-11-20 NOTE — Assessment & Plan Note (Signed)
Calculated BMI is 33.7.  

## 2022-11-20 NOTE — Assessment & Plan Note (Addendum)
Patient with sepsis syndrome present on admission.  Right upper lobe  Acute hypoxemic respiratory failure.   Plan to start patient on IV ceftriaxone and PO azithromycin Bronchodilator therapy, airway clearing techniques with flutter valve and incentive spirometer. Antitussive agents.  Continue oxymetry monitoring, and as needed supplemental 02 per [place] to keep 02 saturation 92% or greater Acetaminophen and ibuprofen for pain and fever control. IV fluids at 75 ml per hr.  Considering severity if influenza infection will add oseltamivir for 5 days.   Further work up with non contrast CT chest to further evaluate pulmonary infiltrates.  If patient does not respond to initial antibiotic therapy will need MRSA coverage, because relation of influenza virus infection and MRSA pneumonia.

## 2022-11-20 NOTE — Hospital Course (Signed)
Jeremy Chambers is being admitted to the hospital with the working diagnosis of community acquired pneumonia in the setting of Influenza A H1 2009 viral infection.

## 2022-11-20 NOTE — Progress Notes (Signed)
Plan of Care Note for accepted transfer   Patient: Jeremy Chambers MRN: [id number]   DOA: 11/20/2022  Facility requesting transfer: [facility] Requesting Provider: Dr. Donnald Garre Reason for transfer: Multifocal PNA Facility course: 41 yo M w/ PMHx of tobacco. Presenting with cough and congestion x 1.5 weeks. W/u revealed multifocal PNA on CXR. He was hypoxic on RA (SpO2 89%) and febrile (103.2). He was also positive for flu. He was started on on rocephin and zithro.   Plan of care: The patient is accepted for admission to Telemetry unit, at [facility]. While holding at [facility], medical decision making responsibilities remain with the EDP. Upon arrival to [facility], [facility] will assume care. Thank you.   Author: Teddy Spike, DO 11/20/2022  Check [email or web address] for on-call coverage.  Nursing staff, Please call [facility] Admits & Consults System-Wide number on Amion as soon as patient's arrival, so appropriate admitting provider can evaluate the pt.

## 2022-11-20 NOTE — H&P (Addendum)
History and Physical    Patient: Jeremy Chambers KKX:[id number] DOB: 1981/11/08 DOA: 11/20/2022 DOS: the patient was seen and examined on 11/20/2022 PCP: Pcp, No  Patient coming from: Home  Chief Complaint:  Chief Complaint  Patient presents with   Cough   HPI: Jeremy Chambers is a 41 y.o. male  with the significant past medical history for tobacco abuse and obesity class 1 who presented with 2 weeks of not feeling well. He started with an upper respiratory tract infection with headaches and sinus congestion, over time it progressed to persistent cough and dyspnea. He had intermittent fever and chills. His cough has been worse with smoke inhalation when smoking. Her has been feeling very weak with persistent malaise along with aches and pains. His symptoms have been persistent despite taking over the counter medications for sinus congestion.  Last night he had severe headaches and worsening dyspnea that prompted to come this am to the ED.   He works at [employer] and he has been recently promoted as Teaching laboratory technician, he has been working long hours.    Review of Systems: As mentioned in the history of present illness. All other systems reviewed and are negative. No gastrointestinal symptoms, no nausea, vomiting or diarrhea.  History reviewed. No pertinent past medical history. Past Surgical History:  Procedure Laterality Date   WISDOM TOOTH EXTRACTION     Social History:  reports that he has been smoking cigarettes. He has been smoking an average of .5 packs per day. He has never used smokeless tobacco. He reports current alcohol use. He reports that he does not use drugs.  No Known Allergies  History reviewed. No pertinent family history. Positive for T2DM   Prior to Admission medications   Medication Sig Start Date End Date Taking? Authorizing Provider  cephALEXin (KEFLEX) 500 MG capsule Take 1 capsule (500 mg total) by mouth 4 (four) times daily. 08/16/21   Theron Arista, PA-C   diphenhydrAMINE (BENADRYL) 25 MG tablet Take 1 tablet (25 mg total) by mouth every 6 (six) hours as needed for itching (Rash). 07/03/13   Eber Hong, MD  doxycycline (VIBRAMYCIN) 100 MG capsule Take 1 capsule (100 mg total) by mouth 2 (two) times daily. 07/25/22   Theron Arista, PA-C  hydrocortisone cream 1 % Apply topically 2 (two) times daily. 07/03/13   Eber Hong, MD  naproxen (NAPROSYN) 375 MG tablet Take 1 tablet (375 mg total) by mouth 2 (two) times daily. 11/01/16   Rise Mu, PA-C    Physical Exam: Vitals:   11/20/22 1345 11/20/22 1400 11/20/22 1405 11/20/22 1415  BP: (!) 141/80 115/65  (!) 135/100  Pulse: 61 85  71  Resp: (!) 24 (!) 22  14  Temp:   (!) 100.8 F (38.2 C)   TempSrc:   Oral   SpO2: 98% 97%  99%  Weight:      Height:       Neurology awake and alert ENT with mild pallor Cardiovascular with S1 and S2 present and rhythmic with no gallops, rubs or murmurs Respiratory with expiratory wheezing and scattered rhonchi, he has rales right upper lobe.  Abdomen with no distention  No lower extremity edema ( he has a left ankle bracelet monitor) No rashes  Data Reviewed:   Na 136, K 3,7 Cl 101 bicarbonate 23, glucose 124, bun 14 cr 0,89  Lactic acid 0,6 Wbc 16,5 hgb 14.0 plt 309  Respiratory viral panel positive for influenza H1 2009   Chest radiograph with right  upper lobe interstitial infiltrates with scattered small opacities right upper lobe.   Mr. Lolli is being admitted to the hospital with the working diagnosis of community acquired pneumonia in the setting of Influenza A H1 2009 viral infection.   Positive symptoms for 2 weeks, started with upper respiratory tract infection and evolved to lower respiratory tract infection. Risk factors of smoking and obesity class 1. On his physical examination he is febrile and with tachypnea and hypoxemia (02 saturation 88% on room air in the ED). Positive leukocytosis and preserved renal function, no  electrolyte abnormalities Chest radiograph with right upper lobe interstitial infiltrate.  Positive influenza A H1 2009.   Assessment and Plan: * Right upper lobe pneumonia Patient with sepsis syndrome present on admission.  Right upper lobe  Acute hypoxemic respiratory failure.   Plan to start patient on IV ceftriaxone and PO azithromycin Bronchodilator therapy, airway clearing techniques with flutter valve and incentive spirometer. Antitussive agents.  Continue oxymetry monitoring, and as needed supplemental 02 per [place] to keep 02 saturation 92% or greater Acetaminophen and ibuprofen for pain and fever control. IV fluids at 75 ml per hr.  Considering severity if influenza infection will add oseltamivir for 5 days.   Further work up with non contrast CT chest to further evaluate pulmonary infiltrates.  If patient does not respond to initial antibiotic therapy will need MRSA coverage, because relation of influenza virus infection and MRSA pneumonia.   Class 1 obesity Calculated BMI is 33.7   Tobacco abuse Smoking cessation counseling       Advance Care Planning:   Code Status: Full Code   Consults: full  Family Communication:  no family at the bedside   Severity of Illness: The appropriate patient status for this patient is INPATIENT. Inpatient status is judged to be reasonable and necessary in order to provide the required intensity of service to ensure the patient's safety. The patient's presenting symptoms, physical exam findings, and initial radiographic and laboratory data in the context of their chronic comorbidities is felt to place them at high risk for further clinical deterioration. Furthermore, it is not anticipated that the patient will be medically stable for discharge from the hospital within 2 midnights of admission.   * I certify that at the point of admission it is my clinical judgment that the patient will require inpatient hospital care spanning beyond 2  midnights from the point of admission due to high intensity of service, high risk for further deterioration and high frequency of surveillance required.*  Author: Coralie Keens, MD 11/20/2022 5:02 PM  For on call review [email or web address].

## 2022-11-20 NOTE — ED Triage Notes (Signed)
Onset about 2 weeks of cough congestion.  Headaches and body aches.  States has been productive cough coughing up blood  States feeling generally tired.   Took tylenol at 0130 am  Having fevers.

## 2022-11-20 NOTE — ED Notes (Signed)
RT note: Pt. placed on HFNC([place]) with RN/MD made aware to obtain sats >/=92%, RT to monitor.

## 2022-11-20 NOTE — Assessment & Plan Note (Signed)
Smoking cessation counseling 

## 2022-11-20 NOTE — ED Notes (Signed)
RT note: Pt. given snack/drink, RN made aware prior.

## 2022-11-21 DIAGNOSIS — A403 Sepsis due to Streptococcus pneumoniae: Secondary | ICD-10-CM | POA: Diagnosis not present

## 2022-11-21 DIAGNOSIS — F17200 Nicotine dependence, unspecified, uncomplicated: Secondary | ICD-10-CM

## 2022-11-21 DIAGNOSIS — R030 Elevated blood-pressure reading, without diagnosis of hypertension: Secondary | ICD-10-CM

## 2022-11-21 DIAGNOSIS — E669 Obesity, unspecified: Secondary | ICD-10-CM | POA: Diagnosis not present

## 2022-11-21 DIAGNOSIS — J9601 Acute respiratory failure with hypoxia: Secondary | ICD-10-CM | POA: Diagnosis not present

## 2022-11-21 DIAGNOSIS — J189 Pneumonia, unspecified organism: Secondary | ICD-10-CM | POA: Diagnosis not present

## 2022-11-21 DIAGNOSIS — R6520 Severe sepsis without septic shock: Secondary | ICD-10-CM

## 2022-11-21 LAB — BASIC METABOLIC PANEL
Anion gap: 7 (ref 5–15)
BUN: 11 mg/dL (ref 6–20)
CO2: 27 mmol/L (ref 22–32)
Calcium: 8 mg/dL — ABNORMAL LOW (ref 8.9–10.3)
Chloride: 105 mmol/L (ref 98–111)
Creatinine, Ser: 0.9 mg/dL (ref 0.61–1.24)
GFR, Estimated: >60 mL/min (ref >60)
Glucose, Bld: 133 mg/dL — ABNORMAL HIGH (ref 70–99)
Potassium: 3.5 mmol/L (ref 3.5–5.1)
Sodium: 139 mmol/L (ref 135–145)

## 2022-11-21 LAB — CBC
HCT: 39.6 % (ref 39.0–52.0)
Hemoglobin: 12.9 g/dL — ABNORMAL LOW (ref 13.0–17.0)
MCH: 30.8 pg (ref 26.0–34.0)
MCHC: 32.6 g/dL (ref 30.0–36.0)
MCV: 94.5 fL (ref 80.0–100.0)
Platelets: 291 10*3/uL (ref 150–400)
RBC: 4.19 MIL/uL — ABNORMAL LOW (ref 4.22–5.81)
RDW: 13.5 % (ref 11.5–15.5)
WBC: 8.9 10*3/uL (ref 4.0–10.5)
nRBC: 0 % (ref 0.0–0.2)

## 2022-11-21 LAB — HIV ANTIBODY (ROUTINE TESTING W REFLEX): HIV Screen 4th Generation wRfx: NONREACTIVE

## 2022-11-21 MED ORDER — AMOXICILLIN-POT CLAVULANATE 875-125 MG PO TABS: 1.0000 | ORAL_TABLET | Freq: Two times a day (BID) | ORAL | 0 refills | Status: AC

## 2022-11-21 MED ORDER — IPRATROPIUM-ALBUTEROL 0.5-2.5 (3) MG/3ML IN SOLN
3.0000 mL | Freq: Three times a day (TID) | RESPIRATORY_TRACT | Status: DC
  Administered 2022-11-21: 3 mL via RESPIRATORY_TRACT
  Filled 2022-11-21 (×2): qty 3

## 2022-11-21 MED ORDER — DOXYCYCLINE HYCLATE 100 MG PO TABS: 100.0000 mg | ORAL_TABLET | Freq: Two times a day (BID) | ORAL | 0 refills | Status: AC

## 2022-11-21 MED ORDER — DOXYCYCLINE HYCLATE 100 MG PO TABS
100.0000 mg | ORAL_TABLET | Freq: Two times a day (BID) | ORAL | Status: DC
  Administered 2022-11-21: 100 mg via ORAL
  Filled 2022-11-21: qty 1

## 2022-11-21 MED ORDER — OSELTAMIVIR PHOSPHATE 75 MG PO CAPS: 75.0000 mg | ORAL_CAPSULE | Freq: Two times a day (BID) | ORAL | 0 refills | Status: AC

## 2022-11-21 MED ORDER — DM-GUAIFENESIN ER 30-600 MG PO TB12: 1.0000 | ORAL_TABLET | Freq: Two times a day (BID) | ORAL | 0 refills | Status: AC

## 2022-11-21 NOTE — Plan of Care (Signed)
Pt leaving at this time. Per pt, his cousin is picking him up and taking him home.  Discharge instructions given/explained with pt verbalizing understanding.  Pt aware to pickup prescriptions at said pharmacy.  Pt aware to followup with PCP.

## 2022-11-21 NOTE — Progress Notes (Signed)
Mobility Specialist - Progress Note   11/21/22 1031  Mobility  Activity Ambulated with assistance in hallway  Level of Assistance Standby assist, set-up cues, supervision of patient - no hands on  Assistive Device Other (Comment) (iv pole)  Distance Ambulated (ft) 500 ft  Activity Response Tolerated well  Mobility Referral Yes  $Mobility charge 1 Mobility   Nurse requested Mobility Specialist to perform oxygen saturation test with pt which includes removing pt from oxygen both at rest and while ambulating.  Below are the results from that testing.     Patient Saturations on Room Air at Rest = spO2 95% Patient Saturations on Room Air while Ambulating = sp02 97% .   At end of testing pt left in room on 0  Liters of oxygen.  Reported results to nurse.   Pt received in bed and agreed to walking sat test. No c/o pain nor discomfort. Pt returned to bed with all needs met.   Roderick Pee Mobility Specialist

## 2022-11-21 NOTE — Discharge Summary (Signed)
Physician Discharge Summary  Jeremy Chambers GMW:[id number] DOB: 09/19/1981 DOA: 11/20/2022  PCP: Pcp, No  Admit date: 11/20/2022 Discharge date: 11/21/2022 Admitted From: Home Disposition: Home Recommendations for Outpatient Follow-up:  Encouraged to establish care with PCP as soon as possible Continue smoking cessation Recommend repeat chest x-ray or CT chest in about 4 to 6 weeks Reassess blood pressure at follow-up. Please follow up on the following pending results: None  Home Health: Not indicated Equipment/Devices: Not indicated  Discharge Condition: Stable CODE STATUS: Full code   Hospital course 41 year old M with PMH of tobacco use disorder and obesity presenting with "not feeling well for about 2 weeks", URI symptoms, headache, cough, shortness of breath, fever, chills and generalized weakness and admitted for right upper lobe pneumonia and influenza A infection.  He was febrile to 103.2 and tachypneic to lower 20s.  Briefly desaturated to 88% and started on 4 L by nasal cannula.  Has a leukocytosis to 15.3.  No lactic acidosis.  Portable chest x-ray concerning for right upper chest with multifocal patchy opacities raising concern for multifocal pneumonia.  Influenza A PCR positive.  COVID-19 PCR nonreactive.  Patient was started on azithromycin, ceftriaxone and Tamiflu.   The next day, blood cultures NGTD.  RVP negative except for influenza A.  Symptoms improved tremendously.  He was liberated off oxygen and ambulated on room air and maintain saturation in upper 90s without respiratory distress.  He felt well and ready to go home.  He is discharged on p.o. Augmentin, doxycycline and Tamiflu for 4 more days to complete treatment course.  He was counseled on the importance of smoking cessation.  He was encouraged to establish care with primary care doctor as soon as possible.  Recommend repeat chest x-ray or CT chest in about 4 to 6 weeks  See individual problem list below for  more.   Problems addressed during this hospitalization Principal Problem:   Severe sepsis due to Streptococcus pneumoniae with acute organ dysfunction [facility]) Active Problems:   Right upper lobe pneumonia   Tobacco use disorder   Class 1 obesity   Acute respiratory failure with hypoxia (HCC)   Elevated blood pressure reading   Severe sepsis due to right upper lobe multifocal pneumonia: POA.  Had fever, leukocytosis, tachycardia pia and acute respiratory failure with hypoxia on presentation.  Sepsis physiology resolved.  Respiratory status improved -Discharged on Augmentin, doxycycline and Tamiflu as above -Counseled on smoking cessation. -Repeat CXR or CT chest in 4 to 6 weeks  Acute respiratory failure with hypoxia: POA.  Resolved.  Elevated blood pressure: Likely from IV fluid.  No history of hypertension. -Reassess at follow-up            Vital signs Vitals:   11/21/22 0544 11/21/22 1003 11/21/22 1031 11/21/22 1246  BP: 132/81 (!) 151/85  (!) 166/90  Pulse: 67 79  68  Temp: 99.3 F (37.4 C) 98.3 F (36.8 C)  99.9 F (37.7 C)  Resp: 15 16  20   Height:      Weight:      SpO2: 98% 98% 97% 98%  TempSrc: Oral Oral  Oral  BMI (Calculated):         Discharge exam  GENERAL: No apparent distress.  Nontoxic. HEENT: MMM.  Vision and hearing grossly intact.  NECK: Supple.  No apparent JVD.  RESP:  No IWOB.  Fair aeration bilaterally. CVS:  RRR. Heart sounds normal.  ABD/GI/GU: BS+. Abd soft, NTND.  MSK/EXT:  Moves extremities. No apparent  deformity. No edema.  SKIN: no apparent skin lesion or wound NEURO: Awake and alert. Oriented appropriately.  No apparent focal neuro deficit. PSYCH: Calm. Normal affect.   Discharge Instructions Discharge Instructions     Call MD for:  difficulty breathing, headache or visual disturbances   Complete by: As directed    Call MD for:  extreme fatigue   Complete by: As directed    Call MD for:  persistant dizziness or  light-headedness   Complete by: As directed    Call MD for:  temperature >100.4   Complete by: As directed    Diet general   Complete by: As directed    Discharge instructions   Complete by: As directed    It has been a pleasure taking care of you!  You were hospitalized due to pneumonia and influenza infection for which you have been started on antibiotics.  Your symptoms improved to the point we think it is safe to let you go home and continue antibiotics orally.  Please review your new medication list and the directions on your medications before you take them.  It is very important that you establish care with primary care doctor as soon as possible.  It is important that you quit smoking cigarettes.  You may use nicotine patch to help you quit smoking.  Nicotine patch is available over-the-counter.  You may also discuss other options to help you quit smoking with your primary care doctor. You can also talk to professional counselors at 1-800-QUIT-NOW [phone]) for free smoking cessation counseling.     Take care,   Increase activity slowly   Complete by: As directed       Allergies as of 11/21/2022   No Known Allergies      Medication List     STOP taking these medications    cephALEXin 500 MG capsule Commonly known as: KEFLEX   diphenhydrAMINE 25 MG tablet Commonly known as: Benadryl   doxycycline 100 MG capsule Commonly known as: VIBRAMYCIN Replaced by: doxycycline 100 MG tablet   hydrocortisone cream 1 %   naproxen 375 MG tablet Commonly known as: NAPROSYN   Tylenol Sinus Severe 5-325-200 MG Tabs Generic drug: Phenylephrine-APAP-guaiFENesin       TAKE these medications    amoxicillin-clavulanate 875-125 MG tablet Commonly known as: AUGMENTIN Take 1 tablet by mouth 2 (two) times daily for 4 days.   dextromethorphan-guaiFENesin 30-600 MG 12hr tablet Commonly known as: MUCINEX DM Take 1 tablet by mouth 2 (two) times daily for 5 days.    doxycycline 100 MG tablet Commonly known as: VIBRA-TABS Take 1 tablet (100 mg total) by mouth every 12 (twelve) hours for 4 days. Replaces: doxycycline 100 MG capsule   guaiFENesin 600 MG 12 hr tablet Commonly known as: MUCINEX Take 600 mg by mouth 2 (two) times daily as needed for cough (congestion).   oseltamivir 75 MG capsule Commonly known as: TAMIFLU Take 1 capsule (75 mg total) by mouth 2 (two) times daily for 4 days.        Consultations: None  Procedures/Studies:   DG Chest Port 1 View  Result Date: 11/20/2022 CLINICAL DATA:  Cough, congestion. EXAM: PORTABLE CHEST 1 VIEW COMPARISON:  None available. FINDINGS: Lung volumes are low. Cardiomediastinal contours and hilar structures are normal accounting for low lung volumes in AP projection. EKG leads project over the chest. Near confluent added density over the RIGHT upper chest. No sign of pleural effusion or pneumothorax on frontal radiograph. Patchy opacities in both  the LEFT and RIGHT chest. RIGHT upper lobe opacity extends to the medial RIGHT chest, in this area there is irregularity of vertebral bodies along the RIGHT lateral margin with loss of clear cortical boundaries. This irregularity could be accentuated by rotation. There was an abnormal appearance of the spine in 2014 the could account for many of these findings though the interface between posterior ribs and chest appeared better delineated. IMPRESSION: 1. Findings suspicious for pneumonia in the RIGHT upper chest with multifocal patchy opacities raising the question of multifocal pneumonia. 2. 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 CT of the chest may be helpful given multifocal nature of above pulmonary findings and potential associated bony abnormalities to exclude underlying neoplasm in the RIGHT upper chest. Electronically Signed   By: Donzetta KohutGeoffrey   Wile M.D.   On: 11/20/2022 10:03       The results of significant diagnostics from this hospitalization (including imaging, microbiology, ancillary and laboratory) are listed below for reference.     Microbiology: Recent Results (from the past 240 hour(s))  Resp Panel by RT-PCR (Flu A&B, Covid) Anterior Nasal Swab     Status: Abnormal   Collection Time: 11/20/22  8:35 AM   Specimen: Anterior Nasal Swab  Result Value Ref Range Status   SARS Coronavirus 2 by RT PCR NEGATIVE NEGATIVE Final    Comment: (NOTE) SARS-CoV-2 target nucleic acids are NOT DETECTED.  The SARS-CoV-2 RNA is generally detectable in upper respiratory specimens during the acute phase of infection. The lowest concentration of SARS-CoV-2 viral copies this assay can detect is 138 copies/mL. A negative result does not preclude SARS-Cov-2 infection and should not be used as the sole basis for treatment or other patient management decisions. A negative result may occur with  improper specimen collection/handling, submission of specimen other than nasopharyngeal swab, presence of viral mutation(s) within the areas targeted by this assay, and inadequate number of viral copies(<138 copies/mL). A negative result must be combined with clinical observations, patient history, and epidemiological information. The expected result is Negative.  Fact Sheet for Patients:  BloggerCourse.comhttps://www.fda.gov/media/152166/download  Fact Sheet for Healthcare Providers:  SeriousBroker.ithttps://www.fda.gov/media/152162/download  This test is no t yet approved or cleared by the [place] FDA and  has been authorized for detection and/or diagnosis of SARS-CoV-2 by FDA under an Emergency Use Authorization (EUA). This EUA will remain  in effect (meaning this test can be used) for the duration of the COVID-19 declaration under Section 564(b)(1) of the Act, 21 U.S.C.section 360bbb-3(b)(1), unless the authorization is terminated  or revoked sooner.        Influenza A by PCR POSITIVE (A) NEGATIVE Final   Influenza B by PCR NEGATIVE NEGATIVE Final    Comment: (NOTE) The Xpert Xpress SARS-CoV-2/FLU/RSV plus assay is intended as an aid in the diagnosis of influenza from Nasopharyngeal swab specimens and should not be used as a sole basis for treatment. Nasal washings and aspirates are unacceptable for Xpert Xpress SARS-CoV-2/FLU/RSV testing.  Fact Sheet for Patients: BloggerCourse.comhttps://www.fda.gov/media/152166/download  Fact Sheet for Healthcare Providers: SeriousBroker.ithttps://www.fda.gov/media/152162/download  This test is not yet approved or cleared by the [place] FDA and has been authorized for detection and/or diagnosis of SARS-CoV-2 by FDA under an Emergency Use Authorization (EUA). This EUA will remain in effect (meaning this test can be used) for the duration of the COVID-19 declaration under Section 564(b)(1) of the Act, 21 U.S.C. section 360bbb-3(b)(1), unless the authorization is terminated or  revoked.  Performed at [facility], [address], [place], [place] [address]   Culture, blood (routine x 2)     Status: None (Preliminary result)   Collection Time: 11/20/22  9:11 AM   Specimen: BLOOD LEFT ARM  Result Value Ref Range Status   Specimen Description   Final    BLOOD LEFT ARM Performed at [facility], [address] [place] [address] [place], [place] [address]    Special Requests   Final    BOTTLES DRAWN AEROBIC AND ANAEROBIC Blood Culture adequate volume Performed at [facility], [address], [place], [place] [address]    Culture   Final    NO GROWTH < 24 HOURS Performed at [facility], [address], [place], [place] [address]    Report Status PENDING  Incomplete  Culture, blood (routine x 2)     Status: None (Preliminary result)   Collection Time: 11/20/22  9:12 AM   Specimen: BLOOD  Result Value Ref Range Status   Specimen Description   Final    BLOOD RIGHT  ANTECUBITAL Performed at [facility], [address], [place], [place] [address]    Special Requests   Final    Blood Culture adequate volume BOTTLES DRAWN AEROBIC AND ANAEROBIC Performed at [facility], [address], [place], [place] [address]    Culture   Final    NO GROWTH < 24 HOURS Performed at [facility], [address], [place], [place] [address]    Report Status PENDING  Incomplete  Respiratory (~20 pathogens) panel by PCR     Status: Abnormal   Collection Time: 11/20/22 10:30 AM   Specimen: Nasopharyngeal Swab; Respiratory  Result Value Ref Range Status   Adenovirus NOT DETECTED NOT DETECTED Final   Coronavirus 229E NOT DETECTED NOT DETECTED Final    Comment: (NOTE) The Coronavirus on the Respiratory Panel, DOES NOT test for the novel  Coronavirus (2019 nCoV)    Coronavirus HKU1 NOT DETECTED NOT DETECTED Final   Coronavirus NL63 NOT DETECTED NOT DETECTED Final   Coronavirus OC43 NOT DETECTED NOT DETECTED Final   Metapneumovirus NOT DETECTED NOT DETECTED Final   Rhinovirus / Enterovirus NOT DETECTED NOT DETECTED Final   Influenza A H1 2009 DETECTED (A) NOT DETECTED Final   Influenza B NOT DETECTED NOT DETECTED Final   Parainfluenza Virus 1 NOT DETECTED NOT DETECTED Final   Parainfluenza Virus 2 NOT DETECTED NOT DETECTED Final   Parainfluenza Virus 3 NOT DETECTED NOT DETECTED Final   Parainfluenza Virus 4 NOT DETECTED NOT DETECTED Final   Respiratory Syncytial Virus NOT DETECTED NOT DETECTED Final   Bordetella pertussis NOT DETECTED NOT DETECTED Final   Bordetella Parapertussis NOT DETECTED NOT DETECTED Final   Chlamydophila pneumoniae NOT DETECTED NOT DETECTED Final   Mycoplasma pneumoniae NOT DETECTED NOT DETECTED Final    Comment: Performed at [facility], [address] [place], [place] [address]     Labs:  CBC: Recent Labs  Lab 11/20/22 0913 11/20/22 1708 11/21/22 0608  WBC 16.5* 15.3* 8.9   NEUTROABS 13.7*  --   --   HGB 14.0 14.5 12.9*  HCT 40.6 42.6 39.6  MCV 90.4 92.0 94.5  PLT 309 305 291   BMP &GFR Recent Labs  Lab 11/20/22 0913 11/20/22 1708 11/21/22 0608  NA 136  --  139  K 3.7  --  3.5  CL 101  --  105  CO2 23  --  27  GLUCOSE 124*  --  133*  BUN 14  --  11  CREATININE 0.89 0.91 0.90  CALCIUM 8.3*  --  8.0*   Estimated Creatinine Clearance: 150.8 mL/min (by C-G formula based on SCr of 0.9 mg/dL). Liver & Pancreas: Recent Labs  Lab 11/20/22 0913  AST 23  ALT 15  ALKPHOS 61  BILITOT 0.5  PROT 6.2*  ALBUMIN 3.5   No results for input(s): "LIPASE", "AMYLASE" in the last 168 hours. No results for input(s): "AMMONIA" in the last 168 hours. Diabetic: No results for input(s): "HGBA1C" in the last 72 hours. No results for input(s): "GLUCAP" in the last 168 hours. Cardiac Enzymes: No results for input(s): "CKTOTAL", "CKMB", "CKMBINDEX", "TROPONINI" in the last 168 hours. No results for input(s): "PROBNP" in the last 8760 hours. Coagulation Profile: No results for input(s): "INR", "PROTIME" in the last 168 hours. Thyroid Function Tests: No results for input(s): "TSH", "T4TOTAL", "FREET4", "T3FREE", "THYROIDAB" in the last 72 hours. Lipid Profile: No results for input(s): "CHOL", "HDL", "LDLCALC", "TRIG", "CHOLHDL", "LDLDIRECT" in the last 72 hours. Anemia Panel: No results for input(s): "VITAMINB12", "FOLATE", "FERRITIN", "TIBC", "IRON", "RETICCTPCT" in the last 72 hours. Urine analysis: No results found for: "COLORURINE", "APPEARANCEUR", "LABSPEC", "PHURINE", "GLUCOSEU", "HGBUR", "BILIRUBINUR", "KETONESUR", "PROTEINUR", "UROBILINOGEN", "NITRITE", "LEUKOCYTESUR" Sepsis Labs: Invalid input(s): "PROCALCITONIN", "LACTICIDVEN"   SIGNED:  Almon Hercules, MD  [facility] 11/21/2022, 1:32 PM

## 2022-11-25 LAB — CULTURE, BLOOD (ROUTINE X 2)
Culture: NO GROWTH
Culture: NO GROWTH
Special Requests: ADEQUATE
Special Requests: ADEQUATE

## 2023-04-01 ENCOUNTER — Emergency Department (HOSPITAL_COMMUNITY)
Admission: EM | Admit: 2023-04-01 | Discharge: 2023-04-02 | Discharge status: Against Medical Advice | Payer: Self-pay | Attending: Student | Admitting: Student

## 2023-04-01 ENCOUNTER — Emergency Department (HOSPITAL_COMMUNITY): Payer: Medicaid Other

## 2023-04-01 DIAGNOSIS — M79601 Pain in right arm: Secondary | ICD-10-CM | POA: Diagnosis present

## 2023-04-01 DIAGNOSIS — R202 Paresthesia of skin: Secondary | ICD-10-CM | POA: Diagnosis not present

## 2023-04-01 DIAGNOSIS — Z5321 Procedure and treatment not carried out due to patient leaving prior to being seen by health care provider: Secondary | ICD-10-CM | POA: Diagnosis not present

## 2023-04-01 DIAGNOSIS — Y92410 Unspecified street and highway as the place of occurrence of the external cause: Secondary | ICD-10-CM | POA: Diagnosis not present

## 2023-04-01 MED ORDER — OXYCODONE-ACETAMINOPHEN 5-325 MG PO TABS
1.0000 | ORAL_TABLET | Freq: Once | ORAL | Status: AC
  Administered 2023-04-01: 1 via ORAL
  Filled 2023-04-01: qty 1
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
## 2023-04-02 ENCOUNTER — Emergency Department (HOSPITAL_BASED_OUTPATIENT_CLINIC_OR_DEPARTMENT_OTHER)
Admission: EM | Admit: 2023-04-02 | Discharge: 2023-04-02 | Disposition: A | Discharge status: Home / Self Care | Payer: Medicaid Other | Attending: Emergency Medicine | Admitting: Emergency Medicine

## 2023-04-02 ENCOUNTER — Other Ambulatory Visit: Payer: Self-pay

## 2023-04-02 DIAGNOSIS — S62356A Nondisplaced fracture of shaft of fifth metacarpal bone, right hand, initial encounter for closed fracture: Secondary | ICD-10-CM

## 2023-04-02 DIAGNOSIS — S6991XA Unspecified injury of right wrist, hand and finger(s), initial encounter: Secondary | ICD-10-CM | POA: Diagnosis present

## 2023-04-02 DIAGNOSIS — S62336A Displaced fracture of neck of fifth metacarpal bone, right hand, initial encounter for closed fracture: Secondary | ICD-10-CM | POA: Diagnosis not present

## 2023-04-02 DIAGNOSIS — M79644 Pain in right finger(s): Secondary | ICD-10-CM | POA: Diagnosis not present

## 2023-04-02 DIAGNOSIS — S62616A Displaced fracture of proximal phalanx of right little finger, initial encounter for closed fracture: Secondary | ICD-10-CM

## 2023-04-02 DIAGNOSIS — S43401A Unspecified sprain of right shoulder joint, initial encounter: Secondary | ICD-10-CM

## 2023-04-02 MED ORDER — LIDOCAINE HCL 1 % IJ SOLN
INTRAMUSCULAR | Status: AC
  Administered 2023-04-02: 20 mL via INTRADERMAL
  Filled 2023-04-02: qty 20

## 2023-04-02 MED ORDER — LIDOCAINE HCL 2 % IJ SOLN: 20.0000 mL | Freq: Once | INTRAMUSCULAR | Status: DC

## 2023-04-02 MED ORDER — HYDROCODONE-ACETAMINOPHEN 5-325 MG PO TABS: 1.0000 | ORAL_TABLET | Freq: Four times a day (QID) | ORAL | 0 refills | Status: AC | PRN

## 2023-04-02 MED ORDER — OXYCODONE-ACETAMINOPHEN 5-325 MG PO TABS
2.0000 | ORAL_TABLET | Freq: Once | ORAL | Status: AC
  Administered 2023-04-02: 2 via ORAL
  Filled 2023-04-02: qty 2

## 2023-04-02 NOTE — Discharge Instructions (Addendum)
Wear splint as applied until followed up by orthopedics.  Ice for 20 minutes every 2 hours while awake for the next 2 days.  Take hydrocodone as prescribed as needed for pain.  Follow-up with hand surgery in the next 1 to 3 days.  The contact information for Dr. Kerry Fort has been provided in this discharge summary for you to call and make these arrangements.
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
## 2023-04-02 NOTE — ED Notes (Signed)
Patient stated he is leaving and going to [facility].
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
## 2023-04-12 ENCOUNTER — Emergency Department (HOSPITAL_BASED_OUTPATIENT_CLINIC_OR_DEPARTMENT_OTHER): Payer: Self-pay

## 2023-04-12 ENCOUNTER — Encounter (HOSPITAL_BASED_OUTPATIENT_CLINIC_OR_DEPARTMENT_OTHER): Payer: Self-pay | Admitting: Emergency Medicine

## 2023-04-12 ENCOUNTER — Emergency Department (HOSPITAL_BASED_OUTPATIENT_CLINIC_OR_DEPARTMENT_OTHER)
Admission: EM | Admit: 2023-04-12 | Discharge: 2023-04-12 | Disposition: A | Discharge status: Home / Self Care | Payer: Self-pay | Attending: Emergency Medicine | Admitting: Emergency Medicine

## 2023-04-12 ENCOUNTER — Other Ambulatory Visit: Payer: Self-pay

## 2023-04-12 DIAGNOSIS — M7551 Bursitis of right shoulder: Secondary | ICD-10-CM | POA: Insufficient documentation

## 2023-04-12 MED ORDER — LIDOCAINE 5 % EX PTCH
1.0000 | MEDICATED_PATCH | CUTANEOUS | Status: DC
  Administered 2023-04-12: 1 via TRANSDERMAL
  Filled 2023-04-12: qty 1

## 2023-04-12 NOTE — ED Notes (Signed)
Patient verbalizes understanding of discharge instructions. Opportunity for questioning and answers were provided. Patient discharged from ED.  °

## 2023-04-12 NOTE — Discharge Instructions (Addendum)
You are seen to the emergency room due to shoulder pain.  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  Ice, elevate he can take ibuprofen 600 mg every 8 hours as needed with food and water.  Do this for about a week help with inflammation.  Follow-up with orthopedic doctor if it continues to cause problems.
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
## 2023-05-17 ENCOUNTER — Other Ambulatory Visit: Payer: Self-pay

## 2024-01-19 ENCOUNTER — Emergency Department (HOSPITAL_BASED_OUTPATIENT_CLINIC_OR_DEPARTMENT_OTHER)
Admission: EM | Admit: 2024-01-19 | Discharge: 2024-01-19 | Disposition: A | Discharge status: Home / Self Care | Payer: Self-pay | Attending: Emergency Medicine | Admitting: Emergency Medicine

## 2024-01-19 ENCOUNTER — Other Ambulatory Visit: Payer: Self-pay

## 2024-01-19 ENCOUNTER — Encounter (HOSPITAL_BASED_OUTPATIENT_CLINIC_OR_DEPARTMENT_OTHER): Payer: Self-pay

## 2024-01-19 DIAGNOSIS — R059 Cough, unspecified: Secondary | ICD-10-CM | POA: Insufficient documentation

## 2024-01-19 DIAGNOSIS — R197 Diarrhea, unspecified: Secondary | ICD-10-CM | POA: Insufficient documentation

## 2024-01-19 DIAGNOSIS — R112 Nausea with vomiting, unspecified: Secondary | ICD-10-CM | POA: Insufficient documentation

## 2024-01-19 DIAGNOSIS — M791 Myalgia, unspecified site: Secondary | ICD-10-CM | POA: Insufficient documentation

## 2024-01-19 DIAGNOSIS — Z20822 Contact with and (suspected) exposure to covid-19: Secondary | ICD-10-CM | POA: Insufficient documentation

## 2024-01-19 LAB — RESP PANEL BY RT-PCR (RSV, FLU A&B, COVID)  RVPGX2
Influenza A by PCR: NEGATIVE
Influenza B by PCR: NEGATIVE
Resp Syncytial Virus by PCR: NEGATIVE
SARS Coronavirus 2 by RT PCR: NEGATIVE

## 2024-01-19 MED ORDER — ONDANSETRON 4 MG PO TBDP: 4.0000 mg | ORAL_TABLET | Freq: Three times a day (TID) | ORAL | 0 refills | Status: AC | PRN

## 2024-01-19 MED ORDER — ONDANSETRON 4 MG PO TBDP
4.0000 mg | ORAL_TABLET | Freq: Once | ORAL | Status: AC
  Administered 2024-01-19: 4 mg via ORAL
  Filled 2024-01-19: qty 1

## 2024-01-19 NOTE — ED Triage Notes (Addendum)
Pt reports he is here today due to flu like s/s that started last night. Pt reports cough, chills, N&V&D.Pt denies being around anyone sick.Pt denies any sob,cp, abd pain

## 2024-01-19 NOTE — ED Notes (Signed)
Provided pt with ginger ale and graham crackers per pt request, will check back with pt to see how PO challenge is going.

## 2024-01-19 NOTE — Discharge Instructions (Addendum)
Glad you are feeling better after the nausea medicine.  I prescribed you some nausea medicine for home.  Make sure you are drinking lots of fluids, and resting.  I have provided you with information, regarding foods to eat, for diarrhea.  Make sure you are not around anyone, when you have a fever, or are sick.  Return to the ER if you cannot keep anything down, or have intractable pain

## 2024-01-19 NOTE — ED Provider Notes (Signed)
[place] EMERGENCY DEPARTMENT AT [facility] Provider Note   CSN: [id number] Arrival date & time: 01/19/24  2140     History  Chief Complaint  Patient presents with   Cough    Jeremy Chambers is a 43 y.o. male, no pertinent past medical history, who presents to the ED secondary to sore throat, nausea, vomiting, diarrhea, and bodyaches, that started  this a.m.  He states since then he has not been able to keep anything down.  States he thinks he may have had a fever of 101.  Has been having copious nausea, vomiting, diarrhea.  Also reports cough.  No abdominal pain, chest pain, or shortness of breath.  Home Medications Prior to Admission medications   Medication Sig Start Date End Date Taking? Authorizing Provider  HYDROcodone-acetaminophen (NORCO) 5-325 MG tablet Take 1-2 tablets by mouth every 6 (six) hours as needed. 04/02/23   Geoffery Lyons, MD      Allergies    Patient has no known allergies.    Review of Systems   Review of Systems  Respiratory:  Positive for cough.   Cardiovascular:  Negative for chest pain.  Gastrointestinal:  Negative for abdominal pain.    Physical Exam Updated Vital Signs BP 125/82 (BP Location: Right Arm)   Pulse 88   Temp 99 F (37.2 C)   Resp 20   Ht 6\' 3"  (1.905 m)   Wt 127 kg   SpO2 99%   BMI 35.00 kg/m  Physical Exam Vitals and nursing note reviewed.  Constitutional:      General: He is not in acute distress.    Appearance: He is well-developed.  HENT:     Head: Normocephalic and atraumatic.     Mouth/Throat:     Mouth: Mucous membranes are moist.  Eyes:     Conjunctiva/sclera: Conjunctivae normal.  Cardiovascular:     Rate and Rhythm: Normal rate and regular rhythm.     Heart sounds: No murmur heard. Pulmonary:     Effort: Pulmonary effort is normal. No respiratory distress.     Breath sounds: Normal breath sounds.  Abdominal:     Palpations: Abdomen is soft.     Tenderness: There is no abdominal tenderness.   Musculoskeletal:        General: No swelling.     Cervical back: Neck supple.  Skin:    General: Skin is warm and dry.     Capillary Refill: Capillary refill takes less than 2 seconds.  Neurological:     Mental Status: He is alert.  Psychiatric:        Mood and Affect: Mood normal.     ED Results / Procedures / Treatments   Labs (all labs ordered are listed, but only abnormal results are displayed) Labs Reviewed  RESP PANEL BY RT-PCR (RSV, FLU A&B, COVID)  [id number]    EKG None  Radiology No results found.  Procedures Procedures    Medications Ordered in ED Medications - No data to display  ED Course/ Medical Decision Making/ A&P                                 Medical Decision Making Patient is a well-appearing 43 year old male, here for nausea, vomiting, fevers, chills, cough that started today.  Has not been able to keep anything down.  He has moist mucosal membranes on exam however, will obtain COVID/flu for further evaluation.  Give Zofran, to  see if symptomatic relief.  Will p.o. challenge.    Amount and/or Complexity of Data Reviewed Labs:     Details: COVID/flu/RSV negative Discussion of management or test interpretation with external provider(s): P.o. challenge, patient able to tolerate crackers, and ginger ale.  Discharged home with Zofran, and return precautions.  Likely represents GI bug, versus viral illness   Risk Prescription drug management.    Final Clinical Impression(s) / ED Diagnoses Final diagnoses:  None    Rx / DC Orders ED Discharge Orders     None         Nyoka Alcoser, Harley [place], PA 01/19/24 2337    Royanne Foots, DO 01/24/24 1147

## 2024-06-01 ENCOUNTER — Emergency Department (HOSPITAL_BASED_OUTPATIENT_CLINIC_OR_DEPARTMENT_OTHER): Payer: Self-pay | Admitting: Radiology

## 2024-06-01 ENCOUNTER — Emergency Department (HOSPITAL_BASED_OUTPATIENT_CLINIC_OR_DEPARTMENT_OTHER)
Admission: EM | Admit: 2024-06-01 | Discharge: 2024-06-01 | Disposition: A | Discharge status: Home / Self Care | Payer: Self-pay | Attending: Emergency Medicine | Admitting: Emergency Medicine

## 2024-06-01 ENCOUNTER — Other Ambulatory Visit: Payer: Self-pay

## 2024-06-01 ENCOUNTER — Encounter (HOSPITAL_BASED_OUTPATIENT_CLINIC_OR_DEPARTMENT_OTHER): Payer: Self-pay | Admitting: Emergency Medicine

## 2024-06-01 DIAGNOSIS — S2231XA Fracture of one rib, right side, initial encounter for closed fracture: Secondary | ICD-10-CM | POA: Insufficient documentation

## 2024-06-01 DIAGNOSIS — W228XXA Striking against or struck by other objects, initial encounter: Secondary | ICD-10-CM | POA: Insufficient documentation

## 2024-06-01 MED ORDER — OXYCODONE HCL 5 MG PO TABS: 5.0000 mg | ORAL_TABLET | Freq: Four times a day (QID) | ORAL | 0 refills | Status: AC | PRN

## 2024-06-01 MED ORDER — IBUPROFEN 800 MG PO TABS
800.0000 mg | ORAL_TABLET | Freq: Once | ORAL | Status: AC
  Administered 2024-06-01: 800 mg via ORAL
  Filled 2024-06-01: qty 1

## 2024-06-01 NOTE — ED Notes (Signed)
 Pt given discharge instructions and reviewed prescriptions. Opportunities given for questions. Pt verbalizes understanding. Jillyn Hidden, RN

## 2024-06-01 NOTE — Discharge Instructions (Addendum)
 You will use the incentive spirometer for the next 10 days, taking 10 slow breaths at a time, every 1-2 hours while awake.  This is an exercise to help prevent pneumonia.  You can continue taking Tylenol  and ibuprofen  over-the-counter together as needed for pain.  I prescribed you oxycodone  which is a narcotic opioid medicine for more severe pain.  Do not drive or perform dangerous activities after taking oxycodone .  Use oxycodone  sparingly.  Long-term use of opioids has been associated with addiction and dependency.

## 2024-06-01 NOTE — ED Notes (Signed)
 RT Note: Patient educated on the use of an Incentive

## 2024-06-01 NOTE — ED Triage Notes (Signed)
 Pt arrived from home with c/o R rib pain 7/10 after climbing through his window this morning.

## 2024-06-01 NOTE — ED Provider Notes (Signed)
 [place] EMERGENCY DEPARTMENT AT [facility] Provider Note   CSN: [id number] Arrival date & time: 06/01/24  4098     History  Chief Complaint  Patient presents with   Chest Pain    Jeremy Chambers is a 43 y.o. male presenting to the emergency department complaint of right-sided rib pain.  The patient reports he locked himself out of the house yesterday and he was trying to climb through the window to get his keys.  He says he struck the window ledge fairly hard with the right side of his ribs and he has been having right-sided rib pain since then.  It is worse with movement and inspiration.  No nausea, vomiting.  HPI     Home Medications Prior to Admission medications   Medication Sig Start Date End Date Taking? Authorizing Provider  oxyCODONE  (ROXICODONE ) 5 MG immediate release tablet Take 1 tablet (5 mg total) by mouth every 6 (six) hours as needed for up to 12 doses for severe pain (pain score 7-10). 06/01/24  Yes Dontarius Sheley, Janalyn Me, MD  HYDROcodone -acetaminophen  (NORCO) 5-325 MG tablet Take 1-2 tablets by mouth every 6 (six) hours as needed. 04/02/23   Orvilla Blander, MD  ondansetron  (ZOFRAN -ODT) 4 MG disintegrating tablet Take 1 tablet (4 mg total) by mouth every 8 (eight) hours as needed. 01/19/24   Small, Brooke L, PA      Allergies    Patient has no known allergies.    Review of Systems   Review of Systems  Physical Exam Updated Vital Signs BP 133/74   Pulse (!) 55   Temp 98.6 F (37 C) (Oral)   Ht 6\' 3"  (1.905 m)   Wt 127 kg   SpO2 97%   BMI 35.00 kg/m  Physical Exam Constitutional:      General: He is not in acute distress. HENT:     Head: Normocephalic and atraumatic.  Eyes:     Conjunctiva/sclera: Conjunctivae normal.     Pupils: Pupils are equal, round, and reactive to light.  Cardiovascular:     Rate and Rhythm: Normal rate and regular rhythm.  Pulmonary:     Effort: Pulmonary effort is normal. No respiratory distress.  Abdominal:      General: There is no distension.     Tenderness: There is no abdominal tenderness.  Musculoskeletal:     Comments: Right lateral lower chest wall tenderness along the ribs, no visible contusion, no flail chest  Skin:    General: Skin is warm and dry.  Neurological:     General: No focal deficit present.     Mental Status: He is alert. Mental status is at baseline.  Psychiatric:        Mood and Affect: Mood normal.        Behavior: Behavior normal.     ED Results / Procedures / Treatments   Labs (all labs ordered are listed, but only abnormal results are displayed) Labs Reviewed - No data to display  EKG None  Radiology DG Ribs Unilateral W/Chest Right Result Date: 06/01/2024 CLINICAL DATA:  Right lateral lower rib pain after trauma EXAM: RIGHT RIBS AND CHEST - 3+ VIEW COMPARISON:  November 20, 2022 FINDINGS: Nondisplaced fracture of the anterior tenth rib. Correlate clinically. IMPRESSION: Fracture right anterior tenth rib Electronically Signed   By: Fredrich Jefferson M.D.   On: 06/01/2024 08:44    Procedures Procedures    Medications Ordered in ED Medications  ibuprofen  (ADVIL ) tablet 800 mg (800 mg Oral Given  06/01/24 1610)    ED Course/ Medical Decision Making/ A&P                                 Medical Decision Making Amount and/or Complexity of Data Reviewed Radiology: ordered.  Risk Prescription drug management.   Patient is here with suspected chest wall injury and rib pain on exam.  X-ray of the chest and ribs are ordered and reviewed by myself, notable for anterior rib fracture.  Oral ibuprofen  was given here in the ED, as the patient was planning to drive himself home and was not given narcotics.  I have a low suspicion for liver laceration or injury as he does not have right upper quadrant tenderness on abdominal exam or any GI complaints, or any rigidity or guarding on abdominal exam.  Low suspicion for ACS, aortic dissection, PE, given the onset & mechanism  of this injury.        Final Clinical Impression(s) / ED Diagnoses Final diagnoses:  Closed fracture of one rib of right side, initial encounter    Rx / DC Orders ED Discharge Orders          Ordered    oxyCODONE  (ROXICODONE ) 5 MG immediate release tablet  Every 6 hours PRN        06/01/24 0905              Arvilla Birmingham, MD 06/01/24 [phone]
